# Patient Record
Sex: Female | Born: 1985 | Race: Black or African American | Hispanic: No | Marital: Married | Smoking: Never smoker
Health system: Southern US, Community
[De-identification: ages and names within clinical notes are randomized; demographics above are authoritative.]

## PROBLEM LIST (undated history)

## (undated) DIAGNOSIS — T7840XA Allergy, unspecified, initial encounter: Secondary | ICD-10-CM

## (undated) DIAGNOSIS — R51 Headache: Secondary | ICD-10-CM

## (undated) DIAGNOSIS — K219 Gastro-esophageal reflux disease without esophagitis: Secondary | ICD-10-CM

## (undated) HISTORY — DX: Allergy, unspecified, initial encounter: T78.40XA

## (undated) HISTORY — DX: Gastro-esophageal reflux disease without esophagitis: K21.9

## (undated) HISTORY — PX: MOUTH SURGERY: SHX715

## (undated) HISTORY — DX: Headache: R51

---
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
## 2004-02-03 ENCOUNTER — Emergency Department (HOSPITAL_COMMUNITY): Admission: EM | Admit: 2004-02-03 | Discharge: 2004-02-03 | Payer: Self-pay | Admitting: Emergency Medicine

## 2004-03-07 ENCOUNTER — Emergency Department (HOSPITAL_COMMUNITY): Admission: EM | Admit: 2004-03-07 | Discharge: 2004-03-07 | Payer: Self-pay | Admitting: Emergency Medicine

## 2004-03-12 ENCOUNTER — Ambulatory Visit: Payer: Self-pay | Admitting: Internal Medicine

## 2004-04-09 ENCOUNTER — Ambulatory Visit: Payer: Self-pay | Admitting: Internal Medicine

## 2004-04-19 ENCOUNTER — Ambulatory Visit: Payer: Self-pay | Admitting: *Deleted

## 2004-07-05 ENCOUNTER — Ambulatory Visit: Payer: Self-pay | Admitting: Internal Medicine

## 2004-07-09 ENCOUNTER — Ambulatory Visit: Payer: Self-pay | Admitting: Internal Medicine

## 2004-12-02 ENCOUNTER — Emergency Department (HOSPITAL_COMMUNITY): Admission: EM | Admit: 2004-12-02 | Discharge: 2004-12-02 | Payer: Self-pay | Admitting: Emergency Medicine

## 2005-01-28 ENCOUNTER — Emergency Department (HOSPITAL_COMMUNITY): Admission: EM | Admit: 2005-01-28 | Discharge: 2005-01-29 | Payer: Self-pay | Admitting: Emergency Medicine

## 2005-03-12 ENCOUNTER — Emergency Department (HOSPITAL_COMMUNITY): Admission: EM | Admit: 2005-03-12 | Discharge: 2005-03-12 | Payer: Self-pay | Admitting: Emergency Medicine

## 2005-03-28 ENCOUNTER — Emergency Department (HOSPITAL_COMMUNITY): Admission: EM | Admit: 2005-03-28 | Discharge: 2005-03-28 | Payer: Self-pay | Admitting: *Deleted

## 2005-06-25 ENCOUNTER — Emergency Department (HOSPITAL_COMMUNITY): Admission: EM | Admit: 2005-06-25 | Discharge: 2005-06-25 | Payer: Self-pay | Admitting: Emergency Medicine

## 2005-06-28 ENCOUNTER — Ambulatory Visit: Payer: Self-pay | Admitting: Internal Medicine

## 2005-07-07 ENCOUNTER — Encounter (INDEPENDENT_AMBULATORY_CARE_PROVIDER_SITE_OTHER): Payer: Self-pay | Admitting: Internal Medicine

## 2005-08-01 ENCOUNTER — Ambulatory Visit: Payer: Self-pay | Admitting: Internal Medicine

## 2005-08-03 ENCOUNTER — Encounter (INDEPENDENT_AMBULATORY_CARE_PROVIDER_SITE_OTHER): Payer: Self-pay | Admitting: Internal Medicine

## 2005-08-15 ENCOUNTER — Ambulatory Visit: Payer: Self-pay | Admitting: Internal Medicine

## 2005-08-18 ENCOUNTER — Ambulatory Visit: Payer: Self-pay | Admitting: Family Medicine

## 2005-12-16 ENCOUNTER — Emergency Department (HOSPITAL_COMMUNITY): Admission: EM | Admit: 2005-12-16 | Discharge: 2005-12-16 | Payer: Self-pay | Admitting: Emergency Medicine

## 2006-10-20 ENCOUNTER — Encounter (INDEPENDENT_AMBULATORY_CARE_PROVIDER_SITE_OTHER): Payer: Self-pay | Admitting: Internal Medicine

## 2006-10-20 DIAGNOSIS — G43909 Migraine, unspecified, not intractable, without status migrainosus: Secondary | ICD-10-CM | POA: Insufficient documentation

## 2006-10-28 DIAGNOSIS — L509 Urticaria, unspecified: Secondary | ICD-10-CM

## 2006-10-28 DIAGNOSIS — J45909 Unspecified asthma, uncomplicated: Secondary | ICD-10-CM | POA: Insufficient documentation

## 2006-11-20 DIAGNOSIS — K219 Gastro-esophageal reflux disease without esophagitis: Secondary | ICD-10-CM

## 2007-11-03 ENCOUNTER — Emergency Department: Payer: Self-pay | Admitting: Emergency Medicine

## 2007-11-08 ENCOUNTER — Emergency Department (HOSPITAL_COMMUNITY): Admission: EM | Admit: 2007-11-08 | Discharge: 2007-11-09 | Payer: Self-pay | Admitting: Emergency Medicine

## 2007-11-22 ENCOUNTER — Emergency Department (HOSPITAL_COMMUNITY): Admission: EM | Admit: 2007-11-22 | Discharge: 2007-11-22 | Payer: Self-pay | Admitting: Emergency Medicine

## 2008-01-31 ENCOUNTER — Ambulatory Visit: Payer: Self-pay | Admitting: Internal Medicine

## 2008-10-16 ENCOUNTER — Ambulatory Visit: Payer: Self-pay | Admitting: Internal Medicine

## 2008-10-16 DIAGNOSIS — G4700 Insomnia, unspecified: Secondary | ICD-10-CM

## 2008-10-16 DIAGNOSIS — R030 Elevated blood-pressure reading, without diagnosis of hypertension: Secondary | ICD-10-CM | POA: Insufficient documentation

## 2008-10-16 DIAGNOSIS — R51 Headache: Secondary | ICD-10-CM

## 2008-10-16 DIAGNOSIS — M79609 Pain in unspecified limb: Secondary | ICD-10-CM

## 2008-10-16 DIAGNOSIS — R519 Headache, unspecified: Secondary | ICD-10-CM | POA: Insufficient documentation

## 2009-02-15 ENCOUNTER — Encounter (INDEPENDENT_AMBULATORY_CARE_PROVIDER_SITE_OTHER): Payer: Self-pay | Admitting: Internal Medicine

## 2009-02-25 ENCOUNTER — Encounter (INDEPENDENT_AMBULATORY_CARE_PROVIDER_SITE_OTHER): Payer: Self-pay | Admitting: Internal Medicine

## 2010-07-06 NOTE — Letter (Signed)
Summary: [facility]   Imported By: Arta Bruce 10/27/2009 15:17:51  _____________________________________________________________________  External Attachment:    Type:   Image     Comment:   External Document

## 2010-08-06 LAB — HEPATITIS B SURFACE ANTIGEN: Hepatitis B Surface Ag: NEGATIVE

## 2010-08-06 LAB — RPR: RPR: NONREACTIVE

## 2010-08-06 LAB — ANTIBODY SCREEN: Antibody Screen: NEGATIVE

## 2010-08-06 LAB — RUBELLA ANTIBODY, IGM: Rubella: IMMUNE

## 2010-08-06 LAB — HIV ANTIBODY (ROUTINE TESTING W REFLEX): HIV: NONREACTIVE

## 2010-11-08 ENCOUNTER — Inpatient Hospital Stay (HOSPITAL_COMMUNITY)
Admission: AD | Admit: 2010-11-08 | Discharge: 2010-11-09 | Disposition: A | Discharge status: Home / Self Care | Payer: Medicaid Other | Source: Ambulatory Visit | Attending: Obstetrics & Gynecology | Admitting: Obstetrics & Gynecology

## 2010-11-08 DIAGNOSIS — R109 Unspecified abdominal pain: Secondary | ICD-10-CM | POA: Insufficient documentation

## 2010-11-08 DIAGNOSIS — O2390 Unspecified genitourinary tract infection in pregnancy, unspecified trimester: Secondary | ICD-10-CM | POA: Insufficient documentation

## 2010-11-08 DIAGNOSIS — B373 Candidiasis of vulva and vagina: Secondary | ICD-10-CM

## 2010-11-08 DIAGNOSIS — N760 Acute vaginitis: Secondary | ICD-10-CM | POA: Insufficient documentation

## 2010-11-08 DIAGNOSIS — N390 Urinary tract infection, site not specified: Secondary | ICD-10-CM | POA: Insufficient documentation

## 2010-11-08 DIAGNOSIS — A499 Bacterial infection, unspecified: Secondary | ICD-10-CM

## 2010-11-08 DIAGNOSIS — B9689 Other specified bacterial agents as the cause of diseases classified elsewhere: Secondary | ICD-10-CM | POA: Insufficient documentation

## 2010-11-08 DIAGNOSIS — B3731 Acute candidiasis of vulva and vagina: Secondary | ICD-10-CM | POA: Insufficient documentation

## 2010-11-08 LAB — WET PREP, GENITAL

## 2010-11-08 LAB — URINE MICROSCOPIC-ADD ON

## 2010-11-08 LAB — URINALYSIS, ROUTINE W REFLEX MICROSCOPIC
Glucose, UA: NEGATIVE mg/dL
pH: 6 (ref 5.0–8.0)

## 2010-11-10 LAB — URINE CULTURE
Colony Count: 75000
Culture  Setup Time: 201206051722

## 2010-12-01 ENCOUNTER — Other Ambulatory Visit: Payer: Self-pay | Admitting: Obstetrics

## 2010-12-01 DIAGNOSIS — O358XX0 Maternal care for other (suspected) fetal abnormality and damage, not applicable or unspecified: Secondary | ICD-10-CM

## 2010-12-09 ENCOUNTER — Encounter (HOSPITAL_COMMUNITY): Payer: Self-pay

## 2010-12-09 ENCOUNTER — Ambulatory Visit (HOSPITAL_COMMUNITY)
Admission: RE | Admit: 2010-12-09 | Discharge: 2010-12-09 | Disposition: A | Discharge status: Home / Self Care | Payer: Medicaid Other | Source: Ambulatory Visit | Attending: Obstetrics | Admitting: Obstetrics

## 2010-12-09 ENCOUNTER — Other Ambulatory Visit: Payer: Self-pay | Admitting: Obstetrics

## 2010-12-09 DIAGNOSIS — O368390 Maternal care for abnormalities of the fetal heart rate or rhythm, unspecified trimester, not applicable or unspecified: Secondary | ICD-10-CM | POA: Insufficient documentation

## 2010-12-09 DIAGNOSIS — Z3689 Encounter for other specified antenatal screening: Secondary | ICD-10-CM | POA: Insufficient documentation

## 2010-12-09 DIAGNOSIS — O358XX0 Maternal care for other (suspected) fetal abnormality and damage, not applicable or unspecified: Secondary | ICD-10-CM

## 2010-12-24 ENCOUNTER — Encounter (HOSPITAL_COMMUNITY): Payer: Self-pay

## 2010-12-24 ENCOUNTER — Ambulatory Visit (HOSPITAL_COMMUNITY)
Admission: RE | Admit: 2010-12-24 | Discharge: 2010-12-24 | Disposition: A | Discharge status: Home / Self Care | Payer: Medicaid Other | Source: Ambulatory Visit | Attending: Obstetrics | Admitting: Obstetrics

## 2010-12-24 DIAGNOSIS — O358XX0 Maternal care for other (suspected) fetal abnormality and damage, not applicable or unspecified: Secondary | ICD-10-CM

## 2010-12-24 DIAGNOSIS — O368390 Maternal care for abnormalities of the fetal heart rate or rhythm, unspecified trimester, not applicable or unspecified: Secondary | ICD-10-CM | POA: Insufficient documentation

## 2011-01-06 ENCOUNTER — Other Ambulatory Visit: Payer: Self-pay | Admitting: Obstetrics

## 2011-01-06 ENCOUNTER — Ambulatory Visit (HOSPITAL_COMMUNITY)
Admission: RE | Admit: 2011-01-06 | Discharge: 2011-01-06 | Disposition: A | Discharge status: Home / Self Care | Payer: Self-pay | Source: Ambulatory Visit | Attending: Obstetrics | Admitting: Obstetrics

## 2011-01-06 VITALS — BP 116/76 | HR 95 | Wt 160.0 lb

## 2011-01-06 DIAGNOSIS — R030 Elevated blood-pressure reading, without diagnosis of hypertension: Secondary | ICD-10-CM

## 2011-01-06 DIAGNOSIS — O2690 Pregnancy related conditions, unspecified, unspecified trimester: Secondary | ICD-10-CM

## 2011-01-06 NOTE — Progress Notes (Signed)
Encounter addended by: Marlana Latus, RN on: 01/06/2011  3:03 PM<BR>     Documentation filed: Flowsheet VN, Episodes, Chief Complaint Section

## 2011-01-06 NOTE — ED Notes (Signed)
Started [employer] 1 week ago, denies leaking or vag bleeding

## 2011-01-06 NOTE — Progress Notes (Signed)
NST reactive, with irregular heart rate consistent with premature atrial contractions.

## 2011-01-13 ENCOUNTER — Ambulatory Visit (HOSPITAL_COMMUNITY)
Admission: RE | Admit: 2011-01-13 | Discharge: 2011-01-13 | Disposition: A | Discharge status: Home / Self Care | Payer: Medicaid Other | Source: Ambulatory Visit | Attending: Obstetrics | Admitting: Obstetrics

## 2011-01-13 ENCOUNTER — Other Ambulatory Visit (HOSPITAL_COMMUNITY): Payer: Self-pay | Admitting: Obstetrics and Gynecology

## 2011-01-13 VITALS — BP 116/74 | HR 97 | Wt 162.0 lb

## 2011-01-13 DIAGNOSIS — Z3689 Encounter for other specified antenatal screening: Secondary | ICD-10-CM | POA: Insufficient documentation

## 2011-01-13 DIAGNOSIS — R030 Elevated blood-pressure reading, without diagnosis of hypertension: Secondary | ICD-10-CM

## 2011-01-13 DIAGNOSIS — O368390 Maternal care for abnormalities of the fetal heart rate or rhythm, unspecified trimester, not applicable or unspecified: Secondary | ICD-10-CM | POA: Insufficient documentation

## 2011-01-13 DIAGNOSIS — Q249 Congenital malformation of heart, unspecified: Secondary | ICD-10-CM

## 2011-01-13 NOTE — Progress Notes (Signed)
Encounter addended by: Marlana Latus, RN on: 01/13/2011  3:55 PM<BR>     Documentation filed: Normajean Glasgow VN

## 2011-01-13 NOTE — Progress Notes (Signed)
Ultrasound in AS/OBGYN/EPIC.  Follow up as scheduled

## 2011-01-20 ENCOUNTER — Ambulatory Visit (HOSPITAL_COMMUNITY)
Admission: RE | Admit: 2011-01-20 | Discharge: 2011-01-20 | Disposition: A | Discharge status: Home / Self Care | Payer: Medicaid Other | Source: Ambulatory Visit | Attending: Obstetrics | Admitting: Obstetrics

## 2011-01-20 DIAGNOSIS — O368390 Maternal care for abnormalities of the fetal heart rate or rhythm, unspecified trimester, not applicable or unspecified: Secondary | ICD-10-CM | POA: Insufficient documentation

## 2011-01-27 ENCOUNTER — Ambulatory Visit (HOSPITAL_COMMUNITY)
Admission: RE | Admit: 2011-01-27 | Discharge: 2011-01-27 | Disposition: A | Discharge status: Home / Self Care | Payer: Medicaid Other | Source: Ambulatory Visit | Attending: Obstetrics | Admitting: Obstetrics

## 2011-01-27 DIAGNOSIS — O368390 Maternal care for abnormalities of the fetal heart rate or rhythm, unspecified trimester, not applicable or unspecified: Secondary | ICD-10-CM | POA: Insufficient documentation

## 2011-02-03 ENCOUNTER — Other Ambulatory Visit: Payer: Self-pay | Admitting: Obstetrics

## 2011-02-03 ENCOUNTER — Ambulatory Visit (HOSPITAL_COMMUNITY)
Admission: RE | Admit: 2011-02-03 | Discharge: 2011-02-03 | Disposition: A | Discharge status: Home / Self Care | Payer: Medicaid Other | Source: Ambulatory Visit | Attending: Obstetrics | Admitting: Obstetrics

## 2011-02-03 DIAGNOSIS — O368390 Maternal care for abnormalities of the fetal heart rate or rhythm, unspecified trimester, not applicable or unspecified: Secondary | ICD-10-CM

## 2011-02-03 NOTE — Progress Notes (Signed)
Vital signs reviewed; see ultrasound report 

## 2011-02-09 ENCOUNTER — Encounter (HOSPITAL_COMMUNITY): Payer: Self-pay | Admitting: *Deleted

## 2011-02-09 ENCOUNTER — Telehealth (HOSPITAL_COMMUNITY): Payer: Self-pay | Admitting: *Deleted

## 2011-02-09 NOTE — Telephone Encounter (Signed)
Preadmission screen  

## 2011-02-10 ENCOUNTER — Inpatient Hospital Stay (HOSPITAL_COMMUNITY): Admission: RE | Admit: 2011-02-10 | Payer: Self-pay | Source: Ambulatory Visit | Admitting: Obstetrics

## 2011-02-11 ENCOUNTER — Inpatient Hospital Stay (HOSPITAL_COMMUNITY)
Admission: AD | Admit: 2011-02-11 | Discharge: 2011-02-15 | DRG: 765 | Disposition: A | Discharge status: Home / Self Care | Payer: Medicaid Other | Source: Ambulatory Visit | Attending: Obstetrics | Admitting: Obstetrics

## 2011-02-11 ENCOUNTER — Encounter (HOSPITAL_COMMUNITY): Payer: Self-pay | Admitting: *Deleted

## 2011-02-11 DIAGNOSIS — G4700 Insomnia, unspecified: Secondary | ICD-10-CM

## 2011-02-11 DIAGNOSIS — J45909 Unspecified asthma, uncomplicated: Secondary | ICD-10-CM

## 2011-02-11 DIAGNOSIS — M79609 Pain in unspecified limb: Secondary | ICD-10-CM

## 2011-02-11 DIAGNOSIS — R51 Headache: Secondary | ICD-10-CM

## 2011-02-11 DIAGNOSIS — L509 Urticaria, unspecified: Secondary | ICD-10-CM

## 2011-02-11 DIAGNOSIS — O411090 Infection of amniotic sac and membranes, unspecified, unspecified trimester, not applicable or unspecified: Secondary | ICD-10-CM | POA: Diagnosis present

## 2011-02-11 DIAGNOSIS — G43909 Migraine, unspecified, not intractable, without status migrainosus: Secondary | ICD-10-CM

## 2011-02-11 DIAGNOSIS — R030 Elevated blood-pressure reading, without diagnosis of hypertension: Secondary | ICD-10-CM

## 2011-02-11 DIAGNOSIS — K219 Gastro-esophageal reflux disease without esophagitis: Secondary | ICD-10-CM

## 2011-02-11 DIAGNOSIS — O4290 Premature rupture of membranes, unspecified as to length of time between rupture and onset of labor, unspecified weeks of gestation: Secondary | ICD-10-CM | POA: Diagnosis present

## 2011-02-11 LAB — CBC
MCH: 26 pg (ref 26.0–34.0)
MCHC: 33.2 g/dL (ref 30.0–36.0)
MCV: 78.1 fL (ref 78.0–100.0)
Platelets: 263 10*3/uL (ref 150–400)
RDW: 15 % (ref 11.5–15.5)
WBC: 7.5 10*3/uL (ref 4.0–10.5)

## 2011-02-11 MED ORDER — EPHEDRINE 5 MG/ML INJ
10.0000 mg | INTRAVENOUS | Status: DC | PRN
  Filled 2011-02-11 (×2): qty 4

## 2011-02-11 MED ORDER — PHENYLEPHRINE 40 MCG/ML (10ML) SYRINGE FOR IV PUSH (FOR BLOOD PRESSURE SUPPORT)
80.0000 ug | PREFILLED_SYRINGE | INTRAVENOUS | Status: DC | PRN
  Filled 2011-02-11 (×2): qty 5

## 2011-02-11 MED ORDER — OXYTOCIN BOLUS FROM INFUSION
500.0000 mL | Freq: Once | INTRAVENOUS | Status: DC
  Filled 2011-02-11: qty 1000
  Filled 2011-02-11: qty 500

## 2011-02-11 MED ORDER — BUTORPHANOL TARTRATE 2 MG/ML IJ SOLN
1.0000 mg | INTRAMUSCULAR | Status: DC | PRN
  Administered 2011-02-11: 1 mg via INTRAVENOUS
  Filled 2011-02-11: qty 1

## 2011-02-11 MED ORDER — EPHEDRINE 5 MG/ML INJ
10.0000 mg | INTRAVENOUS | Status: DC | PRN
  Filled 2011-02-11: qty 4

## 2011-02-11 MED ORDER — OXYTOCIN 20 UNITS IN LACTATED RINGERS INFUSION - SIMPLE
125.0000 mL/h | Freq: Once | INTRAVENOUS | Status: DC
  Filled 2011-02-11: qty 1000

## 2011-02-11 MED ORDER — ACETAMINOPHEN 325 MG PO TABS
650.0000 mg | ORAL_TABLET | ORAL | Status: DC | PRN
  Administered 2011-02-12 (×2): 650 mg via ORAL
  Filled 2011-02-11 (×2): qty 2

## 2011-02-11 MED ORDER — LACTATED RINGERS IV SOLN
500.0000 mL | INTRAVENOUS | Status: DC | PRN
  Administered 2011-02-11: 1000 mL via INTRAVENOUS
  Administered 2011-02-11: 300 mL via INTRAVENOUS

## 2011-02-11 MED ORDER — CITRIC ACID-SODIUM CITRATE 334-500 MG/5ML PO SOLN
30.0000 mL | ORAL | Status: DC | PRN
  Administered 2011-02-12: 30 mL via ORAL
  Filled 2011-02-11: qty 15

## 2011-02-11 MED ORDER — FENTANYL 2.5 MCG/ML BUPIVACAINE 1/10 % EPIDURAL INFUSION (WH - ANES)
14.0000 mL/h | INTRAMUSCULAR | Status: DC
  Administered 2011-02-11 – 2011-02-12 (×6): 14 mL/h via EPIDURAL
  Filled 2011-02-11 (×6): qty 60

## 2011-02-11 MED ORDER — PROMETHAZINE HCL 25 MG/ML IJ SOLN: 12.5000 mg | INTRAMUSCULAR | Status: DC | PRN

## 2011-02-11 MED ORDER — LACTATED RINGERS IV SOLN
INTRAVENOUS | Status: DC
  Administered 2011-02-11 – 2011-02-12 (×5): via INTRAVENOUS
  Administered 2011-02-12: 500 mL via INTRAVENOUS
  Administered 2011-02-12 (×2): via INTRAVENOUS

## 2011-02-11 MED ORDER — FLEET ENEMA 7-19 GM/118ML RE ENEM: 1.0000 | ENEMA | RECTAL | Status: DC | PRN

## 2011-02-11 MED ORDER — TERBUTALINE SULFATE 1 MG/ML IJ SOLN: 0.2500 mg | Freq: Once | INTRAMUSCULAR | Status: AC | PRN

## 2011-02-11 MED ORDER — OXYCODONE-ACETAMINOPHEN 5-325 MG PO TABS: 2.0000 | ORAL_TABLET | ORAL | Status: DC | PRN

## 2011-02-11 MED ORDER — ONDANSETRON HCL 4 MG/2ML IJ SOLN: 4.0000 mg | Freq: Four times a day (QID) | INTRAMUSCULAR | Status: DC | PRN

## 2011-02-11 MED ORDER — IBUPROFEN 600 MG PO TABS: 600.0000 mg | ORAL_TABLET | Freq: Four times a day (QID) | ORAL | Status: DC | PRN

## 2011-02-11 MED ORDER — LACTATED RINGERS IV SOLN
500.0000 mL | Freq: Once | INTRAVENOUS | Status: AC
  Administered 2011-02-11: 1000 mL via INTRAVENOUS

## 2011-02-11 MED ORDER — DIPHENHYDRAMINE HCL 50 MG/ML IJ SOLN: 12.5000 mg | INTRAMUSCULAR | Status: DC | PRN

## 2011-02-11 MED ORDER — LIDOCAINE HCL (PF) 1 % IJ SOLN
30.0000 mL | INTRAMUSCULAR | Status: DC | PRN
  Filled 2011-02-11: qty 30

## 2011-02-11 MED ORDER — OXYTOCIN 20 UNITS IN LACTATED RINGERS INFUSION - SIMPLE
1.0000 m[IU]/min | INTRAVENOUS | Status: DC
  Administered 2011-02-11: 18 m[IU]/min via INTRAVENOUS
  Administered 2011-02-11: 2 m[IU]/min via INTRAVENOUS
  Administered 2011-02-12 (×2): 24 m[IU]/min via INTRAVENOUS
  Administered 2011-02-12: 20 m[IU]/min via INTRAVENOUS

## 2011-02-11 MED ORDER — LIDOCAINE HCL 1.5 % IJ SOLN
INTRAMUSCULAR | Status: DC | PRN
  Administered 2011-02-11 (×2): 5 mL via EPIDURAL
  Administered 2011-02-11: 2 mL via EPIDURAL

## 2011-02-11 MED ORDER — PHENYLEPHRINE 40 MCG/ML (10ML) SYRINGE FOR IV PUSH (FOR BLOOD PRESSURE SUPPORT)
80.0000 ug | PREFILLED_SYRINGE | INTRAVENOUS | Status: DC | PRN
  Filled 2011-02-11: qty 5

## 2011-02-11 NOTE — Anesthesia Preprocedure Evaluation (Signed)
Anesthesia Evaluation  Name, MR# and DOB Patient awake  General Assessment Comment  Reviewed: Allergy & Precautions, H&P , NPO status , Patient's Chart, lab work & pertinent test results, reviewed documented beta blocker date and time   History of Anesthesia Complications Negative for: history of anesthetic complications  Airway Mallampati: I TM Distance: >3 FB Neck ROM: full    Dental  (+) Teeth Intact   Pulmonary  asthma (last inhaler use 2 weeks ago, hospitalized x3 weeks for asthma in 2010 (related to a URI))  clear to auscultation  breath sounds clear to auscultation none    Cardiovascular regular Normal    Neuro/Psych   Headaches (migraines, last 2 mos ago)   Negative Psych ROS  GI/Hepatic/Renal negative GI ROS  negative Liver ROS  negative Renal ROS        Endo/Other  Negative Endocrine ROS (+)      Abdominal   Musculoskeletal   Hematology negative hematology ROS (+)   Peds  Reproductive/Obstetrics (+) Pregnancy    Anesthesia Other Findings Multiple piercings, asked to remove            Anesthesia Physical Anesthesia Plan  ASA: II  Anesthesia Plan: Epidural   Post-op Pain Management:    Induction:   Airway Management Planned:   Additional Equipment:   Intra-op Plan:   Post-operative Plan:   Informed Consent: I have reviewed the patients History and Physical, chart, labs and discussed the procedure including the risks, benefits and alternatives for the proposed anesthesia with the patient or authorized representative who has indicated his/her understanding and acceptance.     Plan Discussed with:   Anesthesia Plan Comments:         Anesthesia Quick Evaluation

## 2011-02-11 NOTE — Anesthesia Procedure Notes (Signed)
Epidural Patient location during procedure: OB Start time: 02/11/2011 3:25 PM Reason for block: procedure for pain  Staffing Performed by: anesthesiologist   Preanesthetic Checklist Completed: patient identified, site marked, surgical consent, pre-op evaluation, timeout performed, IV checked, risks and benefits discussed and monitors and equipment checked  Epidural Patient position: sitting Prep: site prepped and draped and DuraPrep Patient monitoring: continuous pulse ox and blood pressure Approach: midline Injection technique: LOR air  Needle:  Needle type: Tuohy  Needle gauge: 17 G Needle length: 9 cm Catheter type: closed end flexible Catheter size: 19 Gauge Catheter at skin depth: 10 cm Test dose: negative  Assessment Events: blood not aspirated, injection not painful, no injection resistance, negative IV test and no paresthesia  Additional Notes Discussed risk of headache, infection, bleeding, nerve injury and failed or incomplete block.  Patient voices understanding and wishes to proceed.

## 2011-02-11 NOTE — H&P (Signed)
Heidi Hoover is a 25 y.o. female presenting for SROM. Maternal Medical History:  Reason for admission: Reason for admission: rupture of membranes.  Reason for Admission:   nauseaContractions: Frequency: irregular.    Fetal activity: Perceived fetal activity is decreased.   Last perceived fetal movement was within the past 12 hours.    Prenatal complications: Fetal PACs    OB History    Grav Para Term Preterm Abortions TAB SAB Ect Mult Living   1              Past Medical History  Diagnosis Date  . Headache     miagraine  . Asthma     albuterol solution, inhaler, singulair, zertec   Past Surgical History  Procedure Date  . Mouth surgery    Family History: family history includes Arthritis in her maternal grandmother; Asthma in her father; Heart disease in her brother and maternal grandmother; and Mental retardation in her maternal uncle. Social History:  reports that she has never smoked. She does not have any smokeless tobacco history on file. She reports that she does not drink alcohol or use illicit drugs.  Review of Systems  Constitutional: Negative for fever.  Eyes: Negative for blurred vision.  Respiratory: Negative for shortness of breath.   Gastrointestinal: Negative for nausea and vomiting.  Skin: Negative for rash.  Neurological: Negative for headaches.    Dilation: 1 Effacement (%): 90 Station: -2 Exam by:: weston,rn Blood pressure 130/75, pulse 88, temperature 98.6 F (37 C), temperature source Oral, resp. rate 18, height 5\' 2"  (1.575 m), weight 75.297 kg (166 lb), last menstrual period 05/02/2010. Maternal Exam:  Uterine Assessment: Contraction strength is mild.  Contraction frequency is irregular.   Abdomen: Patient reports no abdominal tenderness. Fetal presentation: vertex  Introitus: not evaluated.     Fetal Exam Fetal Monitor Review: Variability: moderate (6-25 bpm).   Pattern: no accelerations and early decelerations.    Fetal State  Assessment: Category II - tracings are indeterminate.     Physical Exam  Constitutional: She appears well-developed.  HENT:  Head: Normocephalic.  Neck: Neck supple. No thyromegaly present.  Cardiovascular: Normal rate and regular rhythm.   Respiratory: Breath sounds normal.  GI: Soft. Bowel sounds are normal.  Skin: No rash noted.    Prenatal labs: ABO, Rh: O/Positive/-- (03/02 0000) Antibody: Negative (03/02 0000) Rubella:   RPR: Nonreactive (03/02 0000)  HBsAg: Negative (03/02 0000)  HIV: Non-reactive (03/02 0000)  GBS: Negative (08/06 0000)   Assessment/Plan: [place] @ [redacted]w[redacted]d w/ PROM Category II FHT--c/w fetal well-being  Admit Augment labor w/low dose Pitocin per protocol   JACKSON-MOORE,Stclair Szymborski A 02/11/2011, 11:14 AM

## 2011-02-11 NOTE — Progress Notes (Signed)
Pt reports ROM at 0500, some contractions

## 2011-02-12 ENCOUNTER — Encounter (HOSPITAL_COMMUNITY): Payer: Self-pay | Admitting: Anesthesiology

## 2011-02-12 ENCOUNTER — Inpatient Hospital Stay (HOSPITAL_COMMUNITY): Payer: Medicaid Other | Admitting: Anesthesiology

## 2011-02-12 ENCOUNTER — Encounter (HOSPITAL_COMMUNITY): Payer: Self-pay

## 2011-02-12 ENCOUNTER — Other Ambulatory Visit: Payer: Self-pay | Admitting: Obstetrics & Gynecology

## 2011-02-12 ENCOUNTER — Encounter (HOSPITAL_COMMUNITY)
Admission: AD | Disposition: A | Discharge status: Home / Self Care | Payer: Self-pay | Source: Ambulatory Visit | Attending: Obstetrics

## 2011-02-12 SURGERY — Surgical Case
Anesthesia: Regional | Site: Abdomen | Wound class: Clean Contaminated

## 2011-02-12 MED ORDER — ONDANSETRON HCL 4 MG/2ML IJ SOLN: 4.0000 mg | INTRAMUSCULAR | Status: DC | PRN

## 2011-02-12 MED ORDER — FENTANYL CITRATE 0.05 MG/ML IJ SOLN
25.0000 ug | INTRAMUSCULAR | Status: DC | PRN
  Administered 2011-02-12: 50 ug via INTRAVENOUS

## 2011-02-12 MED ORDER — DIPHENHYDRAMINE HCL 50 MG/ML IJ SOLN
INTRAMUSCULAR | Status: AC
  Filled 2011-02-12: qty 1

## 2011-02-12 MED ORDER — SODIUM CHLORIDE 0.9 % IJ SOLN: 3.0000 mL | INTRAMUSCULAR | Status: DC | PRN

## 2011-02-12 MED ORDER — IBUPROFEN 600 MG PO TABS
600.0000 mg | ORAL_TABLET | Freq: Four times a day (QID) | ORAL | Status: DC | PRN
  Filled 2011-02-12 (×7): qty 1

## 2011-02-12 MED ORDER — OXYTOCIN 10 UNIT/ML IJ SOLN
INTRAMUSCULAR | Status: AC
  Filled 2011-02-12: qty 4

## 2011-02-12 MED ORDER — MEPERIDINE HCL 25 MG/ML IJ SOLN: 6.2500 mg | INTRAMUSCULAR | Status: DC | PRN

## 2011-02-12 MED ORDER — DIPHENHYDRAMINE HCL 50 MG/ML IJ SOLN: 25.0000 mg | INTRAMUSCULAR | Status: DC | PRN

## 2011-02-12 MED ORDER — LIDOCAINE-EPINEPHRINE (PF) 2 %-1:200000 IJ SOLN
INTRAMUSCULAR | Status: AC
  Filled 2011-02-12: qty 20

## 2011-02-12 MED ORDER — SODIUM BICARBONATE 8.4 % IV SOLN
INTRAVENOUS | Status: DC | PRN
  Administered 2011-02-12: 5 mL via EPIDURAL

## 2011-02-12 MED ORDER — PHENYLEPHRINE HCL 10 MG/ML IJ SOLN
INTRAMUSCULAR | Status: DC | PRN
  Administered 2011-02-12: 80 ug via INTRAVENOUS
  Administered 2011-02-12: 40 ug via INTRAVENOUS
  Administered 2011-02-12: 80 ug via INTRAVENOUS
  Administered 2011-02-12: 40 ug via INTRAVENOUS

## 2011-02-12 MED ORDER — NALOXONE HCL 0.4 MG/ML IJ SOLN
1.0000 ug/kg/h | INTRAMUSCULAR | Status: DC | PRN
  Filled 2011-02-12: qty 2.5

## 2011-02-12 MED ORDER — SODIUM CHLORIDE 0.9 % IR SOLN
Status: DC | PRN
  Administered 2011-02-12: 1000 mL

## 2011-02-12 MED ORDER — NALBUPHINE SYRINGE 5 MG/0.5 ML
5.0000 mg | INJECTION | INTRAMUSCULAR | Status: DC | PRN
  Filled 2011-02-12: qty 1

## 2011-02-12 MED ORDER — MORPHINE SULFATE 0.5 MG/ML IJ SOLN
INTRAMUSCULAR | Status: AC
  Filled 2011-02-12: qty 10

## 2011-02-12 MED ORDER — METOCLOPRAMIDE HCL 5 MG/ML IJ SOLN: 10.0000 mg | Freq: Once | INTRAMUSCULAR | Status: DC | PRN

## 2011-02-12 MED ORDER — OXYTOCIN 20 UNITS IN LACTATED RINGERS INFUSION - SIMPLE
INTRAVENOUS | Status: DC | PRN
  Administered 2011-02-12: 40 [IU] via INTRAVENOUS

## 2011-02-12 MED ORDER — ONDANSETRON HCL 4 MG/2ML IJ SOLN
INTRAMUSCULAR | Status: AC
  Filled 2011-02-12: qty 2

## 2011-02-12 MED ORDER — SIMETHICONE 80 MG PO CHEW
80.0000 mg | CHEWABLE_TABLET | ORAL | Status: DC | PRN
  Administered 2011-02-12 – 2011-02-14 (×10): 80 mg via ORAL

## 2011-02-12 MED ORDER — KETOROLAC TROMETHAMINE 60 MG/2ML IM SOLN
INTRAMUSCULAR | Status: AC
  Administered 2011-02-12: 60 mg via INTRAMUSCULAR
  Filled 2011-02-12: qty 2

## 2011-02-12 MED ORDER — MEPERIDINE HCL 25 MG/ML IJ SOLN
INTRAMUSCULAR | Status: AC
  Filled 2011-02-12: qty 1

## 2011-02-12 MED ORDER — SENNOSIDES-DOCUSATE SODIUM 8.6-50 MG PO TABS
2.0000 | ORAL_TABLET | Freq: Every day | ORAL | Status: DC
  Administered 2011-02-12 – 2011-02-14 (×3): 2 via ORAL

## 2011-02-12 MED ORDER — MORPHINE SULFATE (PF) 0.5 MG/ML IJ SOLN
INTRAMUSCULAR | Status: DC | PRN
  Administered 2011-02-12: 2 mg via INTRAVENOUS

## 2011-02-12 MED ORDER — EPHEDRINE 5 MG/ML INJ
INTRAVENOUS | Status: AC
  Filled 2011-02-12: qty 10

## 2011-02-12 MED ORDER — PHENYLEPHRINE 40 MCG/ML (10ML) SYRINGE FOR IV PUSH (FOR BLOOD PRESSURE SUPPORT)
PREFILLED_SYRINGE | INTRAVENOUS | Status: AC
  Filled 2011-02-12: qty 5

## 2011-02-12 MED ORDER — WITCH HAZEL-GLYCERIN EX PADS: 1.0000 "application " | MEDICATED_PAD | CUTANEOUS | Status: DC | PRN

## 2011-02-12 MED ORDER — MEPERIDINE HCL 25 MG/ML IJ SOLN
INTRAMUSCULAR | Status: DC | PRN
  Administered 2011-02-12: 25 mg via INTRAVENOUS

## 2011-02-12 MED ORDER — OXYTOCIN 20 UNITS IN LACTATED RINGERS INFUSION - SIMPLE
INTRAVENOUS | Status: AC
  Filled 2011-02-12: qty 1000

## 2011-02-12 MED ORDER — DIBUCAINE 1 % RE OINT: 1.0000 "application " | TOPICAL_OINTMENT | RECTAL | Status: DC | PRN

## 2011-02-12 MED ORDER — ZOLPIDEM TARTRATE 5 MG PO TABS: 5.0000 mg | ORAL_TABLET | Freq: Every evening | ORAL | Status: DC | PRN

## 2011-02-12 MED ORDER — MORPHINE SULFATE (PF) 0.5 MG/ML IJ SOLN
INTRAMUSCULAR | Status: DC | PRN
Start: 2011-02-12 — End: 2011-02-12
  Administered 2011-02-12: 3 mg via EPIDURAL

## 2011-02-12 MED ORDER — MAGNESIUM HYDROXIDE 400 MG/5ML PO SUSP: 30.0000 mL | ORAL | Status: DC | PRN

## 2011-02-12 MED ORDER — IBUPROFEN 600 MG PO TABS
600.0000 mg | ORAL_TABLET | Freq: Four times a day (QID) | ORAL | Status: DC
  Administered 2011-02-12 – 2011-02-15 (×10): 600 mg via ORAL
  Filled 2011-02-12 (×3): qty 1

## 2011-02-12 MED ORDER — LANOLIN HYDROUS EX OINT: 1.0000 "application " | TOPICAL_OINTMENT | CUTANEOUS | Status: DC | PRN

## 2011-02-12 MED ORDER — OXYCODONE-ACETAMINOPHEN 5-325 MG PO TABS
1.0000 | ORAL_TABLET | ORAL | Status: DC | PRN
  Administered 2011-02-13 (×4): 1 via ORAL
  Administered 2011-02-14: 2 via ORAL
  Administered 2011-02-14: 1 via ORAL
  Administered 2011-02-14 (×2): 2 via ORAL
  Administered 2011-02-15: 1 via ORAL
  Administered 2011-02-15: 2 via ORAL
  Filled 2011-02-12 (×4): qty 2
  Filled 2011-02-12 (×2): qty 1
  Filled 2011-02-12 (×4): qty 2
  Filled 2011-02-12: qty 1
  Filled 2011-02-12 (×2): qty 2
  Filled 2011-02-12: qty 1
  Filled 2011-02-12 (×5): qty 2
  Filled 2011-02-12: qty 1

## 2011-02-12 MED ORDER — MEASLES, MUMPS & RUBELLA VAC ~~LOC~~ INJ
0.5000 mL | INJECTION | Freq: Once | SUBCUTANEOUS | Status: DC
  Filled 2011-02-12: qty 0.5

## 2011-02-12 MED ORDER — MIDAZOLAM HCL 2 MG/2ML IJ SOLN
INTRAMUSCULAR | Status: AC
  Filled 2011-02-12: qty 2

## 2011-02-12 MED ORDER — PHENYLEPHRINE 40 MCG/ML (10ML) SYRINGE FOR IV PUSH (FOR BLOOD PRESSURE SUPPORT)
PREFILLED_SYRINGE | INTRAVENOUS | Status: AC
  Filled 2011-02-12: qty 10

## 2011-02-12 MED ORDER — MEDROXYPROGESTERONE ACETATE 150 MG/ML IM SUSP: 150.0000 mg | INTRAMUSCULAR | Status: DC | PRN

## 2011-02-12 MED ORDER — ONDANSETRON HCL 4 MG/2ML IJ SOLN
INTRAMUSCULAR | Status: DC | PRN
  Administered 2011-02-12: 4 mg via INTRAVENOUS

## 2011-02-12 MED ORDER — KETOROLAC TROMETHAMINE 30 MG/ML IJ SOLN: 30.0000 mg | Freq: Four times a day (QID) | INTRAMUSCULAR | Status: AC | PRN

## 2011-02-12 MED ORDER — TETANUS-DIPHTH-ACELL PERTUSSIS 5-2.5-18.5 LF-MCG/0.5 IM SUSP
0.5000 mL | Freq: Once | INTRAMUSCULAR | Status: AC
Start: 2011-02-13 — End: 2011-02-13
  Administered 2011-02-13: 0.5 mL via INTRAMUSCULAR
  Filled 2011-02-12: qty 0.5

## 2011-02-12 MED ORDER — DIPHENHYDRAMINE HCL 50 MG/ML IJ SOLN
12.5000 mg | INTRAMUSCULAR | Status: DC | PRN
  Administered 2011-02-12: 12.5 mg via INTRAVENOUS

## 2011-02-12 MED ORDER — DIPHENHYDRAMINE HCL 25 MG PO CAPS
25.0000 mg | ORAL_CAPSULE | ORAL | Status: DC | PRN
  Administered 2011-02-12: 25 mg via ORAL
  Filled 2011-02-12: qty 1

## 2011-02-12 MED ORDER — NALOXONE HCL 0.4 MG/ML IJ SOLN: 0.4000 mg | INTRAMUSCULAR | Status: DC | PRN

## 2011-02-12 MED ORDER — SCOPOLAMINE 1 MG/3DAYS TD PT72
MEDICATED_PATCH | TRANSDERMAL | Status: AC
  Administered 2011-02-12: 1.5 mg via TRANSDERMAL
  Filled 2011-02-12: qty 1

## 2011-02-12 MED ORDER — SODIUM BICARBONATE 8.4 % IV SOLN
INTRAVENOUS | Status: AC
  Filled 2011-02-12: qty 50

## 2011-02-12 MED ORDER — CLINDAMYCIN PHOSPHATE 900 MG/50ML IV SOLN
900.0000 mg | Freq: Three times a day (TID) | INTRAVENOUS | Status: DC
  Administered 2011-02-12 (×2): 900 mg via INTRAVENOUS
  Filled 2011-02-12 (×4): qty 50

## 2011-02-12 MED ORDER — FERROUS SULFATE 325 (65 FE) MG PO TABS
325.0000 mg | ORAL_TABLET | Freq: Two times a day (BID) | ORAL | Status: DC
  Administered 2011-02-13 – 2011-02-15 (×5): 325 mg via ORAL
  Filled 2011-02-12 (×5): qty 1

## 2011-02-12 MED ORDER — PRENATAL PLUS 27-1 MG PO TABS
1.0000 | ORAL_TABLET | Freq: Every day | ORAL | Status: DC
  Administered 2011-02-13 – 2011-02-15 (×3): 1 via ORAL
  Filled 2011-02-12 (×3): qty 1

## 2011-02-12 MED ORDER — FENTANYL CITRATE 0.05 MG/ML IJ SOLN
INTRAMUSCULAR | Status: AC
  Filled 2011-02-12: qty 2

## 2011-02-12 MED ORDER — FENTANYL CITRATE 0.05 MG/ML IJ SOLN
INTRAMUSCULAR | Status: AC
  Filled 2011-02-12: qty 5

## 2011-02-12 MED ORDER — LACTATED RINGERS IV SOLN: INTRAVENOUS | Status: DC

## 2011-02-12 MED ORDER — ACETAMINOPHEN 325 MG PO TABS
650.0000 mg | ORAL_TABLET | Freq: Once | ORAL | Status: AC
  Administered 2011-02-12: 650 mg via ORAL
  Filled 2011-02-12: qty 2

## 2011-02-12 MED ORDER — DIPHENHYDRAMINE HCL 25 MG PO CAPS: 25.0000 mg | ORAL_CAPSULE | Freq: Four times a day (QID) | ORAL | Status: DC | PRN

## 2011-02-12 MED ORDER — SCOPOLAMINE 1 MG/3DAYS TD PT72
1.0000 | MEDICATED_PATCH | Freq: Once | TRANSDERMAL | Status: AC
  Administered 2011-02-12: 1.5 mg via TRANSDERMAL

## 2011-02-12 MED ORDER — GENTAMICIN SULFATE 40 MG/ML IJ SOLN
150.0000 mg | Freq: Once | INTRAVENOUS | Status: AC
  Administered 2011-02-12: 150 mg via INTRAVENOUS
  Filled 2011-02-12: qty 3.75

## 2011-02-12 MED ORDER — KETOROLAC TROMETHAMINE 60 MG/2ML IM SOLN
60.0000 mg | Freq: Once | INTRAMUSCULAR | Status: AC | PRN
  Administered 2011-02-12: 60 mg via INTRAMUSCULAR

## 2011-02-12 MED ORDER — OXYTOCIN 20 UNITS IN LACTATED RINGERS INFUSION - SIMPLE
125.0000 mL/h | INTRAVENOUS | Status: AC
  Administered 2011-02-12: 125 mL/h via INTRAVENOUS
  Filled 2011-02-12: qty 1000

## 2011-02-12 SURGICAL SUPPLY — 48 items
APL SKNCLS STERI-STRIP NONHPOA (GAUZE/BANDAGES/DRESSINGS) ×1
BENZOIN TINCTURE PRP APPL 2/3 (GAUZE/BANDAGES/DRESSINGS) ×1 IMPLANT
CANISTER WOUND CARE 500ML ATS (WOUND CARE) IMPLANT
CHLORAPREP W/TINT 26ML (MISCELLANEOUS) ×2 IMPLANT
CLOSURE STERI STRIP 1/2 X4 (GAUZE/BANDAGES/DRESSINGS) ×1 IMPLANT
CLOTH BEACON ORANGE TIMEOUT ST (SAFETY) ×2 IMPLANT
CONTAINER PREFILL 10% NBF 15ML (MISCELLANEOUS) IMPLANT
DERMABOND ADVANCED (GAUZE/BANDAGES/DRESSINGS) ×1 IMPLANT
DRESSING TELFA 8X3 (GAUZE/BANDAGES/DRESSINGS) ×2 IMPLANT
DRSG COVADERM 4X10 (GAUZE/BANDAGES/DRESSINGS) ×1 IMPLANT
DRSG PAD ABDOMINAL 8X10 ST (GAUZE/BANDAGES/DRESSINGS) ×1 IMPLANT
DRSG VAC ATS LRG SENSATRAC (GAUZE/BANDAGES/DRESSINGS) IMPLANT
DRSG VAC ATS MED SENSATRAC (GAUZE/BANDAGES/DRESSINGS) IMPLANT
DRSG VAC ATS SM SENSATRAC (GAUZE/BANDAGES/DRESSINGS) IMPLANT
ELECT REM PT RETURN 9FT ADLT (ELECTROSURGICAL) ×2
ELECTRODE REM PT RTRN 9FT ADLT (ELECTROSURGICAL) ×1 IMPLANT
EXTRACTOR VACUUM M CUP 4 TUBE (SUCTIONS) IMPLANT
GAUZE SPONGE 4X4 12PLY STRL LF (GAUZE/BANDAGES/DRESSINGS) ×2 IMPLANT
GLOVE BIO SURGEON STRL SZ 6.5 (GLOVE) ×4 IMPLANT
GLOVE SKINSENSE NS SZ6.5 (GLOVE) ×2
GLOVE SKINSENSE STRL SZ6.5 (GLOVE) IMPLANT
GOWN PREVENTION PLUS LG XLONG (DISPOSABLE) ×5 IMPLANT
KIT ABG SYR 3ML LUER SLIP (SYRINGE) IMPLANT
NDL HYPO 25X5/8 SAFETYGLIDE (NEEDLE) ×1 IMPLANT
NEEDLE HYPO 25X5/8 SAFETYGLIDE (NEEDLE) IMPLANT
NS IRRIG 1000ML POUR BTL (IV SOLUTION) ×2 IMPLANT
PACK C SECTION WH (CUSTOM PROCEDURE TRAY) ×2 IMPLANT
PAD ABD 7.5X8 STRL (GAUZE/BANDAGES/DRESSINGS) ×2 IMPLANT
RTRCTR C-SECT PINK 25CM LRG (MISCELLANEOUS) IMPLANT
RTRCTR C-SECT PINK 34CM XLRG (MISCELLANEOUS) IMPLANT
SLEEVE SCD COMPRESS KNEE MED (MISCELLANEOUS) ×1 IMPLANT
STAPLER VISISTAT 35W (STAPLE) IMPLANT
SUT MNCRL 0 VIOLET CTX 36 (SUTURE) ×2 IMPLANT
SUT MNCRL AB 3-0 PS2 27 (SUTURE) ×1 IMPLANT
SUT MONOCRYL 0 CTX 36 (SUTURE) ×2
SUT PDS AB 0 CTX 36 PDP370T (SUTURE) ×1 IMPLANT
SUT PLAIN 0 NONE (SUTURE) IMPLANT
SUT VIC AB 0 CT1 27 (SUTURE) ×4
SUT VIC AB 0 CT1 27XBRD ANBCTR (SUTURE) IMPLANT
SUT VIC AB 2-0 CT1 (SUTURE) IMPLANT
SUT VIC AB 2-0 CT1 27 (SUTURE) ×2
SUT VIC AB 2-0 CT1 TAPERPNT 27 (SUTURE) ×1 IMPLANT
SUT VIC AB 2-0 SH 27 (SUTURE)
SUT VIC AB 2-0 SH 27XBRD (SUTURE) IMPLANT
TAPE CLOTH SURG 4X10 WHT LF (GAUZE/BANDAGES/DRESSINGS) ×1 IMPLANT
TOWEL OR 17X24 6PK STRL BLUE (TOWEL DISPOSABLE) ×3 IMPLANT
TRAY FOLEY CATH 14FR (SET/KITS/TRAYS/PACK) ×1 IMPLANT
WATER STERILE IRR 1000ML POUR (IV SOLUTION) ×2 IMPLANT

## 2011-02-12 NOTE — Progress Notes (Signed)
IV antibiotics started after 500cc LR bolus, change IV rate to 175cc/h per Dr. Tamela Oddi.  Tylenol given for fever

## 2011-02-12 NOTE — OR Nursing (Signed)
Uterus massaged by S. Terrence Pizana Charity fundraiser. Foley catheter in place on arrival to OR. Urine color blood tinged.

## 2011-02-12 NOTE — Anesthesia Postprocedure Evaluation (Signed)
  Anesthesia Post-op Note  Patient: Heidi Hoover  Procedure(s) Performed:  CESAREAN SECTION - Primary cesarean section with delivery of baby boy at 73. Apgars 9/9.  Patient Location: PACU  Anesthesia Type: Epidural  Level of Consciousness: awake, alert  and oriented  Airway and Oxygen Therapy: Patient Spontanous Breathing  Post-op Pain: none  Post-op Assessment: Post-op Vital signs reviewed, Patient's Cardiovascular Status Stable, Respiratory Function Stable, Patent Airway, No signs of Nausea or vomiting, Pain level controlled, No headache, No backache, No residual numbness and No residual motor weakness  Post-op Vital Signs: Reviewed and stable  Complications: No apparent anesthesia complications

## 2011-02-12 NOTE — Progress Notes (Signed)
Heidi Hoover is a 25 y.o. G1P0 at [redacted]w[redacted]d by LMP admitted for rupture of membranes  Subjective:  Comfortable   Objective: BP 103/52  Pulse 80  Temp(Src) 99.1 F (37.3 C) (Oral)  Resp 18  Ht 5\' 2"  (1.575 m)  Wt 75.297 kg (166 lb)  BMI 30.36 kg/m2  SpO2 97%  LMP 05/02/2010 I/O last 3 completed shifts: In: -  Out: 850 [Urine:850]    FHT:  FHR: 140 bpm, variability: moderate,  accelerations:  Present,  decelerations:  Present variable, moderate UC:   regular, every 4 minutes SVE:   Dilation: 8.5 Effacement (%): Thick Station: 0 Exam by:: jackson-moore  Labs: Lab Results  Component Value Date   WBC 7.5 02/11/2011   HGB 12.1 02/11/2011   HCT 36.4 02/11/2011   MCV 78.1 02/11/2011   PLT 263 02/11/2011    Assessment / Plan: Arrest in active phase of labor  Labor: See above Preeclampsia:  N/A Fetal Wellbeing:  Category II; c/w fetal well-being Pain Control:  Epidural I/D:  Chorioamnionits on antibiotics Anticipated MOD:  C/D; informed consent obtained  JACKSON-MOORE,Lamees Gable A 02/12/2011, 10:21 AM

## 2011-02-12 NOTE — Op Note (Signed)
Cesarean Section Procedure Note   Heidi Hoover   02/12/2011  Indications: Dystocia   Pre-operative Diagnosis: arrest of active labor.   Post-operative Diagnosis: Same   Surgeon: Antionette Char A  Assistants: none  Anesthesia: epidural  Procedure Details:  The patient was seen in the Holding Room. The risks, benefits, complications, treatment options, and expected outcomes were discussed with the patient. The patient concurred with the proposed plan, giving informed consent. The patient was identified as Heidi Hoover and the procedure verified as C-Section Delivery. A Time Out was held and the above information confirmed.  After induction of anesthesia, the patient was draped and prepped in the usual sterile manner. A transverse incision was made and carried down through the subcutaneous tissue to the fascia. The fascial incision was made and extended transversely. The fascia was separated from the underlying rectus tissue superiorly and inferiorly. The peritoneum was identified and entered. The peritoneal incision was extended longitudinally. The utero-vesical peritoneal reflection was incised transversely and the bladder flap was bluntly freed from the lower uterine segment. A low transverse uterine incision was made. Delivered from cephalic presentation was a living newborn female infant.  A cord ph was not sent. The umbilical cord was clamped and cut cord. A sample was obtained for evaluation. The placenta was removed Intact and appeared normal.  The uterine incision was closed with running locked sutures of 1-0 Monocryl. A second imbricating layer of the same suture was placed.  Hemostasis was observed. The paracolic gutters were irrigated. The fascia was then reapproximated with running sutures of 1-0Vicryl. The subcuticular closure was performed using 3-0 Monocryl.  Instrument, sponge, and needle counts were correct prior the abdominal closure and were correct at the conclusion of the  case.    Findings:  Loose nuchal cord x 1.  Occiput posterior.   Estimated Blood Loss: 800 ml  Total IV Fluids:  per Anesthesiology   Urine Output:  per Anesthesiology  Specimens:  Specimens    None       Complications: no complications  Disposition: PACU - hemodynamically stable.  Maternal Condition: stable   Baby condition / location:  nursery-stable    Signed: Surgeon(s): Roseanna Rainbow, MD

## 2011-02-12 NOTE — Progress Notes (Signed)
Called Dr. Tamela Oddi to report slow cervical change, fhr and variables, vs.  No new orders at this time.  States she will be into see pt when making rounds this am.

## 2011-02-12 NOTE — Transfer of Care (Signed)
Immediate Anesthesia Transfer of Care Note  Patient: Heidi Hoover  Procedure(s) Performed:  CESAREAN SECTION - Primary cesarean section with delivery of baby boy at 31. Apgars 9/9.  Patient Location: PACU  Anesthesia Type: Epidural  Level of Consciousness: awake, alert  and oriented  Airway & Oxygen Therapy: Patient Spontanous Breathing  Post-op Assessment: Report given to PACU RN and Post -op Vital signs reviewed and stable  Post vital signs: Reviewed and stable  Complications: No apparent anesthesia complications

## 2011-02-12 NOTE — OR Nursing (Signed)
Two tubes of cord blood sent to lab.

## 2011-02-13 ENCOUNTER — Inpatient Hospital Stay (HOSPITAL_COMMUNITY): Admission: RE | Admit: 2011-02-13 | Payer: Self-pay | Source: Ambulatory Visit

## 2011-02-13 NOTE — Progress Notes (Signed)
  Subjective: POD# 1 s/p Cesarean Delivery.  Indications: failure to progress  RH status/Rubella reviewed. Feeding: breast Patient reports tolerating PO.  Denies HA/SOB/C/P/N/V/dizziness.  Reports flatus or BM. Breast symptoms:None.  She reports vaginal bleeding as normal, without clots.  She is ambulating, urinating without difficulty.     Objective: Vital signs in last 24 hours: BP 113/72  Pulse 103  Temp(Src) 98.6 F (37 C) (Oral)  Resp 18  Ht 5\' 2"  (1.575 m)  Wt 75.297 kg (166 lb)  BMI 30.36 kg/m2  SpO2 97%  LMP 05/02/2010  Breastfeeding? Unknown   Total I/O In: 960 [P.O.:960] Out: 1250 [Urine:1250]   Physical Exam:  General: alert CV: Regular rate and rhythm Resp: clear Abdomen: soft, nontender, normal bowel sounds Lochia: minimal Uterine Fundus: firm, below umbilicus, nontender Incision: clean, dry and intact Ext: extremities normal, atraumatic, no cyanosis or edema    Basename 02/13/11 0534 02/11/11 0720  HGB 10.4* 12.1  HCT -- 36.4      Assessment/Plan: 25 y.o.  status post Cesarean section. POD# 1.   Doing well, stable.              Advance diet as tolerated Start po pain meds D/C foley  HLIV  Ambulate IS Routine post-op care  JACKSON-MOORE,Roemello Speyer A 02/13/2011, 5:08 PM

## 2011-02-14 NOTE — Progress Notes (Signed)
  Pt started om kpad for continued incisional pain. Moderate relief achieved

## 2011-02-14 NOTE — Progress Notes (Signed)
Subjective: Postpartum Day 2: Cesarean Delivery Patient reports incisional pain, tolerating PO, + flatus and no problems voiding.    Objective: Vital signs in last 24 hours: Temp:  [98.4 F (36.9 C)-98.8 F (37.1 C)] 98.5 F (36.9 C) (09/10 0600) Pulse Rate:  [86-103] 88  (09/10 0600) Resp:  [18] 18  (09/10 0600) BP: (107-128)/(66-79) 108/66 mmHg (09/10 0600) SpO2:  [97 %] 97 % (09/09 1125)  Physical Exam:  General: alert and no distress Lochia: appropriate Uterine Fundus: firm Incision: healing well DVT Evaluation: No evidence of DVT seen on physical exam.   Basename 02/13/11 0534  HGB 10.4*  HCT --    Assessment/Plan: Status post Cesarean section. Doing well postoperatively.  Continue current care.  HARPER,CHARLES A 02/14/2011, 9:07 AM

## 2011-02-14 NOTE — Progress Notes (Signed)
UR chart review completed.  

## 2011-02-15 MED ORDER — IBUPROFEN 600 MG PO TABS: 600.0000 mg | ORAL_TABLET | Freq: Four times a day (QID) | ORAL | Status: DC

## 2011-02-15 MED ORDER — OXYCODONE-ACETAMINOPHEN 5-325 MG PO TABS: 1.0000 | ORAL_TABLET | ORAL | Status: DC | PRN

## 2011-02-15 NOTE — Progress Notes (Signed)
Post Partum Day 3 Subjective: no complaints, up ad lib, voiding, tolerating PO and + flatus  Objective: Blood pressure 99/59, pulse 75, temperature 97.6 F (36.4 C), temperature source Oral, resp. rate 18, height 5\' 2"  (1.575 m), weight 75.297 kg (166 lb), last menstrual period 05/02/2010, SpO2 97.00%, unknown if currently breastfeeding.  Physical Exam:  General: alert and no distress Lochia: appropriate Uterine Fundus: firm Incision: healing well DVT Evaluation: No evidence of DVT seen on physical exam.   Basename 02/13/11 0534  HGB 10.4*  HCT --    Assessment/Plan: Discharge home   LOS: 4 days   HARPER,CHARLES A 02/15/2011, 9:19 AM

## 2011-02-15 NOTE — Discharge Summary (Signed)
Obstetric Discharge Summary Reason for Admission: induction of labor Prenatal Procedures: ultrasound Intrapartum Procedures: cesarean: low cervical, transverse Postpartum Procedures: antibiotics Complications-Operative and Postpartum: none Hemoglobin  Date Value Range Status  02/13/2011 10.4* 12.0-15.0 (g/dL) Final     HCT  Date Value Range Status  02/11/2011 36.4  36.0-46.0 (%) Final    Discharge Diagnoses: Term Pregnancy-delivered  Discharge Information: Date: 02/15/2011 Activity: pelvic rest Diet: routine Medications: PNV, Ibuprophen and Percocet Condition: stable Instructions: refer to practice specific booklet Discharge to: home Follow-up Information    Follow up with Roseanna Rainbow, MD. Make an appointment in 2 weeks.   Contact information:   [address], [address] [place] [address] [phone]          Newborn Data: Live born female  Birth Weight: 7 lb 1.1 oz (3205 g) APGAR: 9, 9  Home with mother.  HARPER,CHARLES A 02/15/2011, 9:43 AM

## 2011-03-02 ENCOUNTER — Encounter (HOSPITAL_COMMUNITY): Payer: Self-pay | Admitting: Obstetrics & Gynecology

## 2011-03-09 HISTORY — PX: OTHER SURGICAL HISTORY: SHX169

## 2011-10-04 IMAGING — US US FETAL BPP W/O NONSTRESS
1 series · 7 of 7 positions shown · non-contrast
Comparison: none

[Series 1: us fetal bpp w/o nonstress · 0.26mm/px · 7 of 7 slices shown]
[im 1/7]
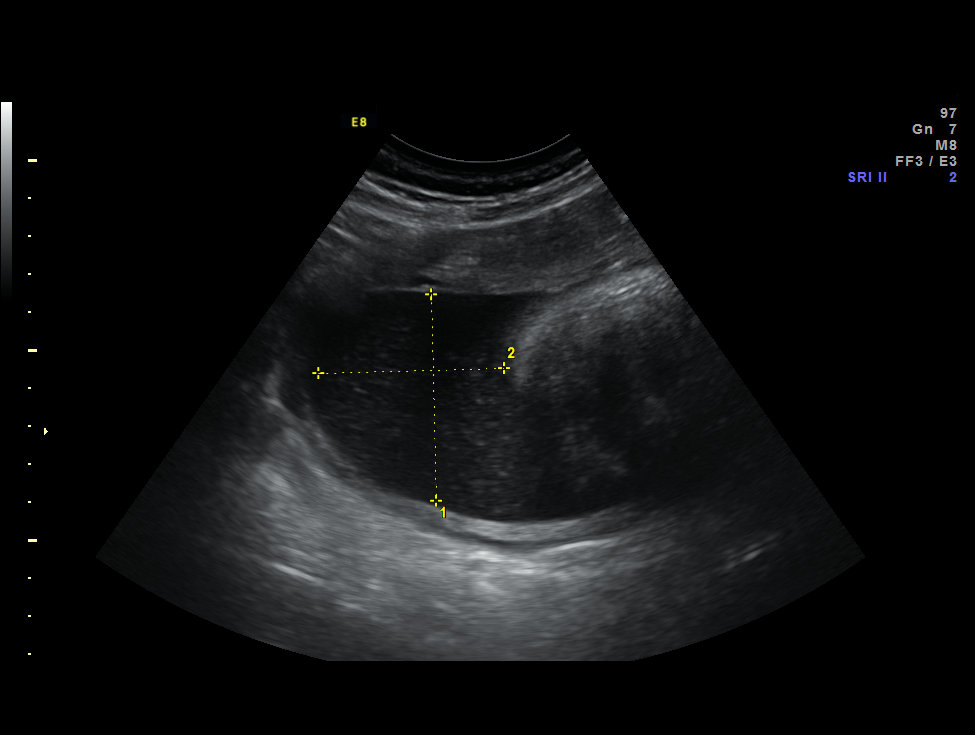
[im 2/7]
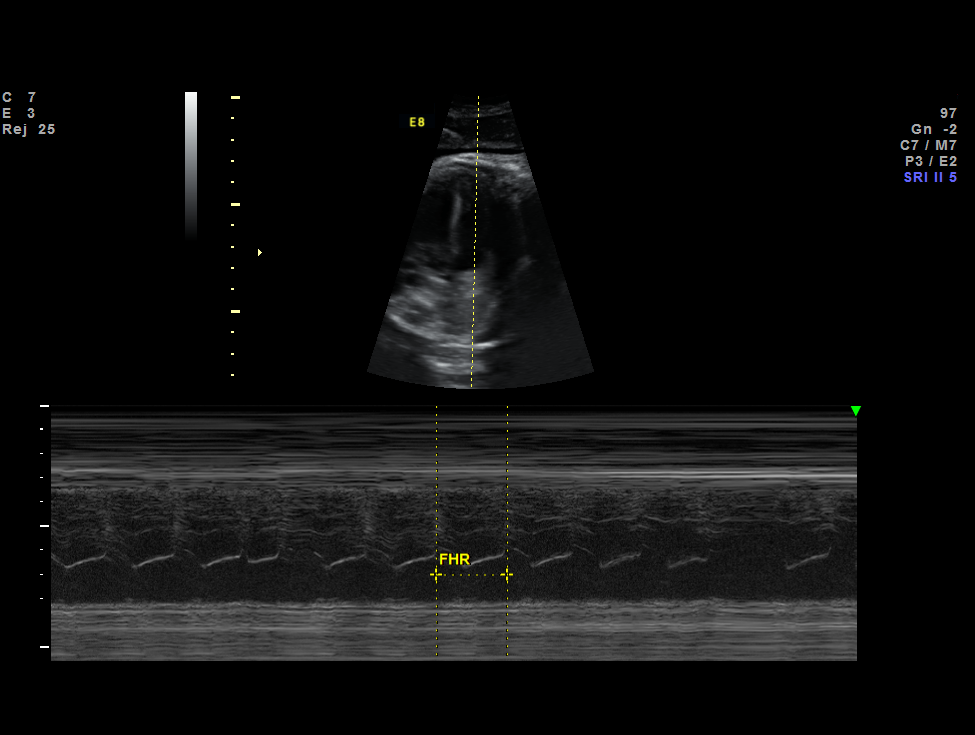
[im 3/7]
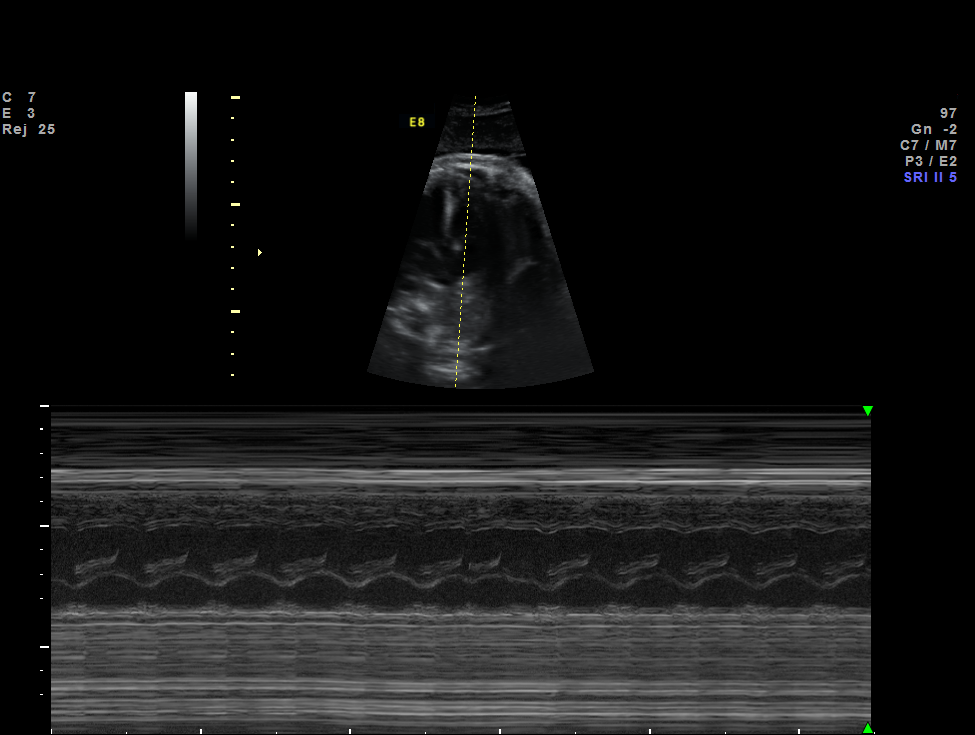
[im 4/7]
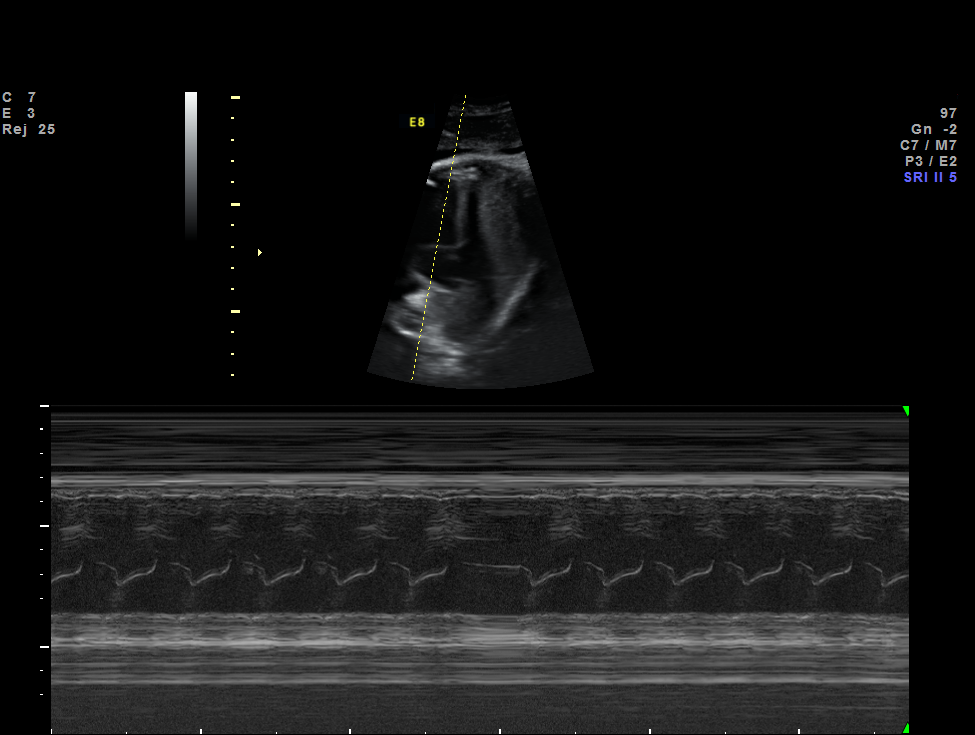
[im 5/7]
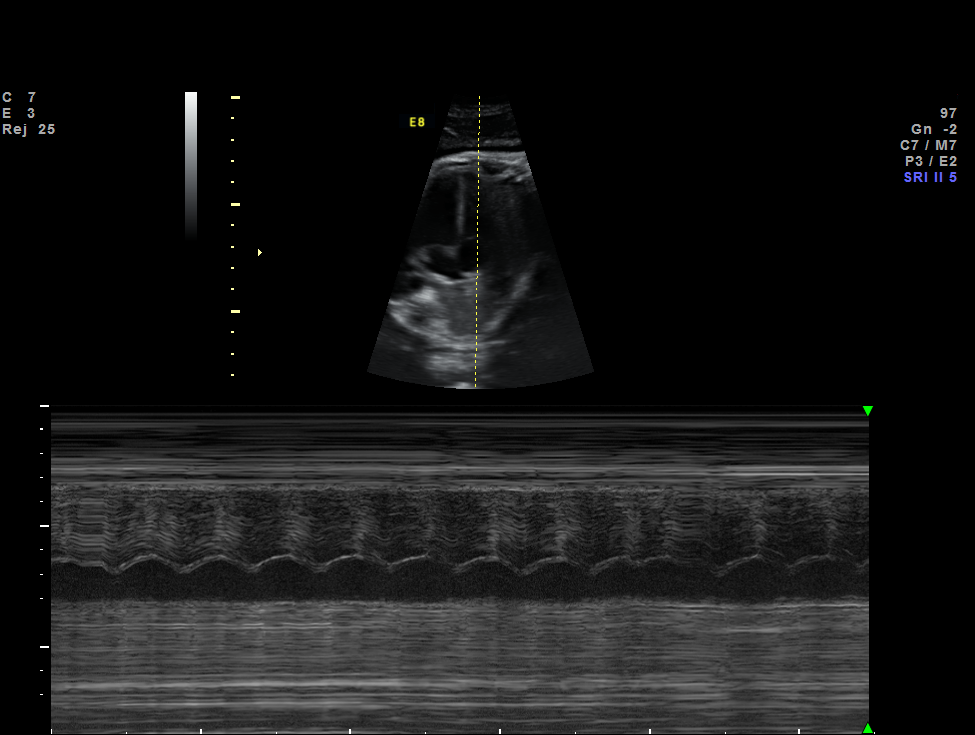
[im 6/7]
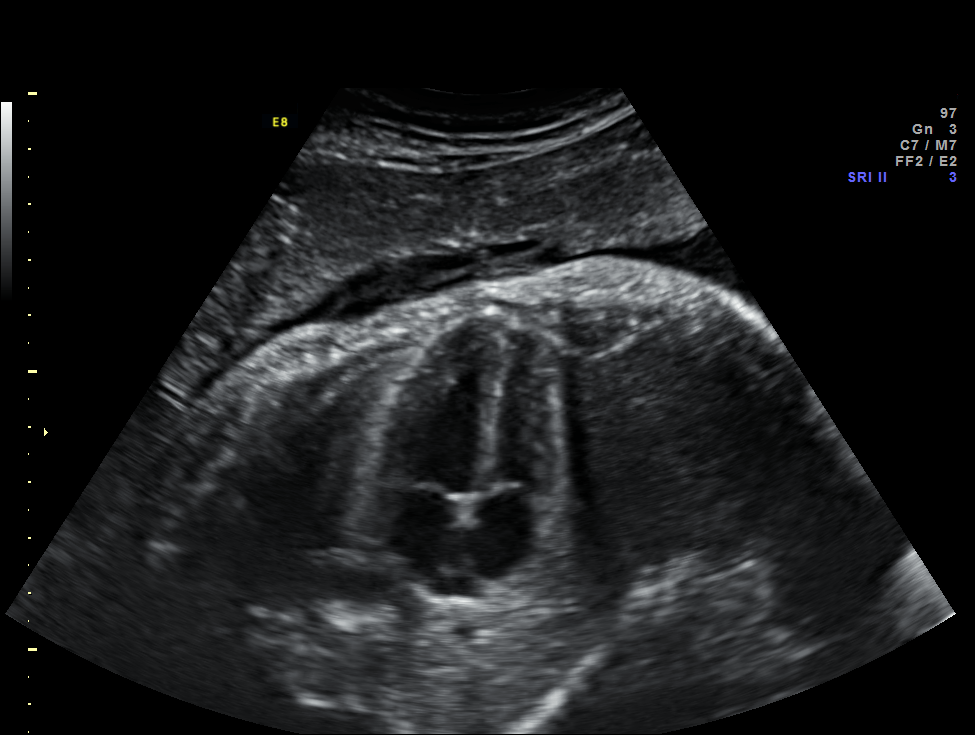
[im 7/7]
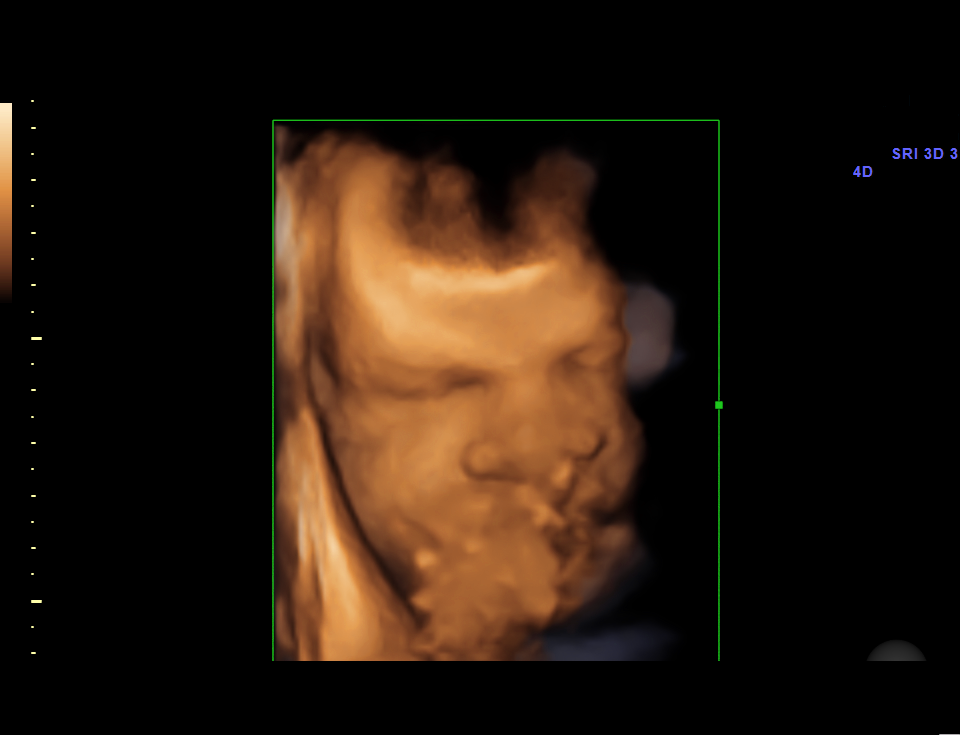

[7 of 7 positions shown; findings below may reference images not displayed]

Canned report from images found in remote index.

Refer to host system for actual result text.

## 2012-11-20 ENCOUNTER — Encounter: Payer: Self-pay | Admitting: Family Medicine

## 2012-11-20 ENCOUNTER — Ambulatory Visit (INDEPENDENT_AMBULATORY_CARE_PROVIDER_SITE_OTHER): Payer: BC Managed Care – PPO | Admitting: Family Medicine

## 2012-11-20 VITALS — BP 130/89 | HR 98 | Wt 135.0 lb

## 2012-11-20 DIAGNOSIS — N946 Dysmenorrhea, unspecified: Secondary | ICD-10-CM

## 2012-11-20 DIAGNOSIS — J45909 Unspecified asthma, uncomplicated: Secondary | ICD-10-CM

## 2012-11-20 MED ORDER — MONTELUKAST SODIUM 10 MG PO TABS: 10.0000 mg | ORAL_TABLET | Freq: Every day | ORAL | Status: AC

## 2012-11-20 MED ORDER — BUDESONIDE 0.5 MG/2ML IN SUSP: 0.5000 mg | Freq: Two times a day (BID) | RESPIRATORY_TRACT | Status: AC

## 2012-11-20 MED ORDER — ALBUTEROL SULFATE (2.5 MG/3ML) 0.083% IN NEBU: 2.5000 mg | INHALATION_SOLUTION | Freq: Four times a day (QID) | RESPIRATORY_TRACT | Status: AC | PRN

## 2012-11-20 MED ORDER — NORETHINDRONE ACET-ETHINYL EST 1-20 MG-MCG PO TABS: ORAL_TABLET | ORAL | Status: DC

## 2012-11-20 MED ORDER — IBUPROFEN 600 MG PO TABS: 600.0000 mg | ORAL_TABLET | Freq: Three times a day (TID) | ORAL | Status: AC | PRN

## 2012-11-20 MED ORDER — ALBUTEROL SULFATE HFA 108 (90 BASE) MCG/ACT IN AERS: 2.0000 | INHALATION_SPRAY | Freq: Four times a day (QID) | RESPIRATORY_TRACT | Status: AC | PRN

## 2012-11-20 NOTE — Progress Notes (Signed)
  Subjective:    Patient ID: Heidi Hoover, female    DOB: 1986-05-27, 27 y.o.   MRN: [id number]  HPI  Heidi Hoover is here today to discuss a few issues.  She was last seen by our office at [facility] in 06/2009.  She says that she has done well since her last visit. The biggest change in her life is that she had a baby (son) in 2012.    1)  Asthma:   She needs all her asthma/allergy medications refilled.  2)  Dysmenorrhea:  She had an Implanon placed in her left arm since 2012.  She has been having irregular, heavy periods and wants to discuss starting on oral contraceptives.   Review of Systems  Genitourinary: Positive for menstrual problem.   Past Medical History  Diagnosis Date  . Headache(784.0)     miagraine  . Asthma     albuterol solution, inhaler, singulair, zertec  . Allergy   . GERD (gastroesophageal reflux disease)    Family History  Problem Relation Age of Onset  . Asthma Father   . Heart disease Brother   . Mental retardation Maternal Uncle   . Arthritis Maternal Grandmother   . Heart disease Maternal Grandmother   . Hypertension Maternal Grandmother    History   Social History Narrative   Marital Status: Married Heidi Hoover)   Children:  7 Son    Pets: None    Living Situation: Lives with husband.   Occupation: CNA Chartered loss adjuster)   Education: Engineer, agricultural    Tobacco Use/Exposure:  None    Alcohol Use:  None   Drug Use:  None   Diet:  Regular   Exercise:  None   Hobbies: Cooking/Eating                Objective:   Physical Exam  Constitutional: She appears well-nourished. No distress.  HENT:  Head: Normocephalic.  Eyes: No scleral icterus.  Neck: No thyromegaly present.  Cardiovascular: Normal rate, regular rhythm and normal heart sounds.   Pulmonary/Chest: Effort normal and breath sounds normal.  Abdominal: There is no tenderness.  Musculoskeletal: She exhibits no edema and no tenderness.  Neurological: She is alert.  Skin:  Skin is warm and dry.  Psychiatric: She has a normal mood and affect. Her behavior is normal. Judgment and thought content normal.          Assessment & Plan:

## 2012-12-23 ENCOUNTER — Encounter: Payer: Self-pay | Admitting: Family Medicine

## 2012-12-23 DIAGNOSIS — N946 Dysmenorrhea, unspecified: Secondary | ICD-10-CM | POA: Insufficient documentation

## 2012-12-23 NOTE — Assessment & Plan Note (Addendum)
She was given refills for Singulair, Pulmicort and Albuterol.

## 2012-12-23 NOTE — Assessment & Plan Note (Signed)
She was given prescriptions for Ibuprofen 600 mg and Loestrin.

## 2014-04-07 ENCOUNTER — Encounter: Payer: Self-pay | Admitting: Family Medicine

## 2014-07-15 ENCOUNTER — Telehealth: Payer: Self-pay | Admitting: *Deleted

## 2014-07-15 NOTE — Telephone Encounter (Signed)
Patient interested in having her Nexplanon removed. Patient had it inserted 07-14-11. Patient states she is going to use birth control pills after removal. Patient states her PCP has already given her a prescription for Yasmin. Patient scheduled for 07-31-14 @ 1 pm.

## 2014-07-31 ENCOUNTER — Encounter: Payer: Self-pay | Admitting: Obstetrics

## 2014-07-31 ENCOUNTER — Ambulatory Visit (INDEPENDENT_AMBULATORY_CARE_PROVIDER_SITE_OTHER): Payer: BLUE CROSS/BLUE SHIELD | Admitting: Obstetrics

## 2014-07-31 VITALS — BP 127/81 | HR 83 | Wt 145.0 lb

## 2014-07-31 DIAGNOSIS — Z3202 Encounter for pregnancy test, result negative: Secondary | ICD-10-CM

## 2014-07-31 DIAGNOSIS — Z3009 Encounter for other general counseling and advice on contraception: Secondary | ICD-10-CM

## 2014-07-31 DIAGNOSIS — Z3046 Encounter for surveillance of implantable subdermal contraceptive: Secondary | ICD-10-CM

## 2014-07-31 DIAGNOSIS — IMO0002 Reserved for concepts with insufficient information to code with codable children: Secondary | ICD-10-CM

## 2014-07-31 DIAGNOSIS — Z3049 Encounter for surveillance of other contraceptives: Secondary | ICD-10-CM

## 2014-07-31 LAB — POCT URINE PREGNANCY: PREG TEST UR: NEGATIVE

## 2014-08-01 ENCOUNTER — Encounter: Payer: Self-pay | Admitting: Obstetrics

## 2014-08-01 NOTE — Progress Notes (Signed)

## 2014-08-14 ENCOUNTER — Ambulatory Visit: Payer: BLUE CROSS/BLUE SHIELD | Admitting: Obstetrics

## 2014-08-15 ENCOUNTER — Ambulatory Visit: Payer: BLUE CROSS/BLUE SHIELD | Admitting: Obstetrics

## 2015-12-25 ENCOUNTER — Telehealth: Payer: Self-pay | Admitting: *Deleted

## 2015-12-25 NOTE — Telephone Encounter (Signed)
Pt called to office regarding Nexplanon placement Return call to pt.  Pt states that she was told she would need 2 different appt in order to get a Nexplanon placed.   Pt was made aware that she is in need of an annual exam. Pt states that she had her exam with pap smear with her family doctor.   Pt advised to bring those records to office at her next appt in order to have them added to her chart here, along with her current ins information. Pt made aware that she should keep her appt as scheduled and if Dr Clearance CootsHarper chooses to insert a Nexplanon he may do that same day. Pt states understanding.

## 2016-01-13 ENCOUNTER — Encounter: Payer: Self-pay | Admitting: Obstetrics

## 2016-01-13 ENCOUNTER — Ambulatory Visit (INDEPENDENT_AMBULATORY_CARE_PROVIDER_SITE_OTHER): Payer: BLUE CROSS/BLUE SHIELD | Admitting: Obstetrics

## 2016-01-13 VITALS — BP 122/82 | HR 83 | Temp 98.2°F | Ht 62.0 in | Wt 147.6 lb

## 2016-01-13 DIAGNOSIS — Z30017 Encounter for initial prescription of implantable subdermal contraceptive: Secondary | ICD-10-CM

## 2016-01-13 DIAGNOSIS — Z3202 Encounter for pregnancy test, result negative: Secondary | ICD-10-CM | POA: Diagnosis not present

## 2016-01-13 DIAGNOSIS — Z30018 Encounter for initial prescription of other contraceptives: Secondary | ICD-10-CM

## 2016-01-13 DIAGNOSIS — Z3049 Encounter for surveillance of other contraceptives: Secondary | ICD-10-CM

## 2016-01-13 LAB — POCT URINE PREGNANCY: PREG TEST UR: NEGATIVE

## 2016-01-13 MED ORDER — NORETHINDRONE ACET-ETHINYL EST 1-20 MG-MCG PO TABS: ORAL_TABLET | ORAL | 99 refills | Status: AC

## 2016-01-13 NOTE — Progress Notes (Signed)
Nexplanon Procedure Note   PRE-OP DIAGNOSIS: desired long-term, reversible contraception  POST-OP DIAGNOSIS: Same  PROCEDURE: Nexplanon  placement Performing Provider: Coral Ceoharles Paizleigh Wilds MD  Patient education prior to procedure, explained risk, benefits of Nexplanon, reviewed alternative options. Patient reported understanding. Gave consent to continue with procedure.   PROCEDURE:  Pregnancy Text :  Negative Site (check):      left arm         Sterile Preparation:   Betadinex3 Lot # [id number] Expiration Date 08 / 2019  Insertion site was selected 8 - 10 cm from medial epicondyle and marked along with guiding site using sterile marker. Procedure area was prepped and draped in a sterile fashion. 1% Lidocaine 1.5 ml given prior to procedure. Nexplanon  was inserted subcutaneously.Needle was removed from the insertion site. Nexplanon capsule was palpated by provider and patient to assure satisfactory placement. Dressing applied.  Followup: The patient tolerated the procedure well without complications.  Standard post-procedure care is explained and return precautions are given.  Coral Ceoharles Devynn Scheff MD

## 2017-05-12 ENCOUNTER — Encounter: Payer: Self-pay | Admitting: Obstetrics

## 2017-05-12 ENCOUNTER — Ambulatory Visit: Payer: BLUE CROSS/BLUE SHIELD | Admitting: Obstetrics

## 2017-05-12 ENCOUNTER — Other Ambulatory Visit: Payer: Self-pay

## 2017-05-12 VITALS — BP 108/72 | HR 76 | Ht 62.0 in | Wt 136.0 lb

## 2017-05-12 DIAGNOSIS — Z3049 Encounter for surveillance of other contraceptives: Secondary | ICD-10-CM

## 2017-05-12 DIAGNOSIS — Z3009 Encounter for other general counseling and advice on contraception: Secondary | ICD-10-CM

## 2017-05-12 DIAGNOSIS — Z3046 Encounter for surveillance of implantable subdermal contraceptive: Secondary | ICD-10-CM

## 2017-05-12 DIAGNOSIS — Z30011 Encounter for initial prescription of contraceptive pills: Secondary | ICD-10-CM

## 2017-05-12 MED ORDER — LO LOESTRIN FE 1 MG-10 MCG / 10 MCG PO TABS: 1.0000 | ORAL_TABLET | Freq: Every day | ORAL | 4 refills | Status: AC

## 2017-05-12 NOTE — Progress Notes (Signed)
Presents for Nexplanon Removal. Her periods have been irregular since insertion, bleeding for 3++ weeks every month. Complains of cramps in her right calf 9/10.  She wants OCP.

## 2017-05-12 NOTE — Progress Notes (Signed)
NEXPLANON REMOVAL NOTE  Date of LMP:   unknown  Contraception used: *Nexplanon   Indications:  The patient desires removal of Nexplanon.  She understands risks, benefits, and alternatives to Nexplanon and would like to proceed.  Anesthesia:   Lidocaine 1% plain.  Procedure:  A time-out was performed confirming the procedure and the patient's allergy status.  Complications: None                      The rod was palpated and the area was sterilely prepped.  The area beneath the distal tip was anesthetized with 1% xylocaine and the skin incised                       Over the tip and the tip was exposed, grasped with forcep and removed intact.  A single suture of 4-0 Vicryl was used to close incision.  Steri strip                       And a bandage applied and the arm was wrapped with gauze bandage.  The patient tolerated well.  Instructions:  The patient was instructed to remove the dressing in 24 hours and that some bruising is to be expected.  She was advised to use over the counter analgesics as needed for any pain at the site.  She is to keep the area dry for 24 hours and to call if her hand or arm becomes cold, numb, or blue.  Return visit:  Return in 2 weeks

## 2017-05-23 ENCOUNTER — Telehealth: Payer: Self-pay | Admitting: Pediatrics

## 2017-05-23 ENCOUNTER — Other Ambulatory Visit: Payer: Self-pay | Admitting: Obstetrics

## 2017-05-23 DIAGNOSIS — Z30011 Encounter for initial prescription of contraceptive pills: Secondary | ICD-10-CM

## 2017-05-23 MED ORDER — NORETHIN ACE-ETH ESTRAD-FE 1-20 MG-MCG(24) PO TABS: 1.0000 | ORAL_TABLET | Freq: Every day | ORAL | 11 refills | Status: AC

## 2017-05-23 NOTE — Telephone Encounter (Signed)
Lo Loestrin FE is not covered on pt's ins. Pharmacy requesting alternative be sent. Please advise.

## 2017-05-23 NOTE — Telephone Encounter (Signed)
Loestrin 24 Fe prescribed.

## 2017-06-20 ENCOUNTER — Encounter: Payer: Self-pay | Admitting: *Deleted

## 2022-08-30 ENCOUNTER — Encounter (HOSPITAL_BASED_OUTPATIENT_CLINIC_OR_DEPARTMENT_OTHER): Payer: Self-pay

## 2022-08-30 ENCOUNTER — Emergency Department (HOSPITAL_BASED_OUTPATIENT_CLINIC_OR_DEPARTMENT_OTHER): Payer: Medicaid Other | Admitting: Radiology

## 2022-08-30 ENCOUNTER — Other Ambulatory Visit: Payer: Self-pay

## 2022-08-30 ENCOUNTER — Emergency Department (HOSPITAL_BASED_OUTPATIENT_CLINIC_OR_DEPARTMENT_OTHER)
Admission: EM | Admit: 2022-08-30 | Discharge: 2022-08-30 | Disposition: A | Discharge status: Home / Self Care | Payer: Medicaid Other | Attending: Emergency Medicine | Admitting: Emergency Medicine

## 2022-08-30 DIAGNOSIS — E876 Hypokalemia: Secondary | ICD-10-CM

## 2022-08-30 DIAGNOSIS — R002 Palpitations: Secondary | ICD-10-CM

## 2022-08-30 LAB — CBC WITH DIFFERENTIAL/PLATELET
Abs Immature Granulocytes: 0.01 10*3/uL (ref 0.00–0.07)
Basophils Absolute: 0 10*3/uL (ref 0.0–0.1)
Basophils Relative: 1 %
Eosinophils Absolute: 0.2 10*3/uL (ref 0.0–0.5)
Eosinophils Relative: 4 %
HCT: 42.6 % (ref 36.0–46.0)
Hemoglobin: 13.8 g/dL (ref 12.0–15.0)
Immature Granulocytes: 0 %
Lymphocytes Relative: 33 %
Lymphs Abs: 1.5 10*3/uL (ref 0.7–4.0)
MCH: 25.3 pg — ABNORMAL LOW (ref 26.0–34.0)
MCHC: 32.4 g/dL (ref 30.0–36.0)
MCV: 78 fL — ABNORMAL LOW (ref 80.0–100.0)
Monocytes Absolute: 0.5 10*3/uL (ref 0.1–1.0)
Monocytes Relative: 11 %
Neutro Abs: 2.3 10*3/uL (ref 1.7–7.7)
Neutrophils Relative %: 51 %
Platelets: 413 10*3/uL — ABNORMAL HIGH (ref 150–400)
RBC: 5.46 MIL/uL — ABNORMAL HIGH (ref 3.87–5.11)
RDW: 14.1 % (ref 11.5–15.5)
WBC: 4.4 10*3/uL (ref 4.0–10.5)
nRBC: 0 % (ref 0.0–0.2)

## 2022-08-30 LAB — COMPREHENSIVE METABOLIC PANEL
ALT: 11 U/L (ref 0–44)
AST: 16 U/L (ref 15–41)
Albumin: 4.3 g/dL (ref 3.5–5.0)
Alkaline Phosphatase: 70 U/L (ref 38–126)
Anion gap: 8 (ref 5–15)
BUN: 11 mg/dL (ref 6–20)
CO2: 27 mmol/L (ref 22–32)
Calcium: 9.5 mg/dL (ref 8.9–10.3)
Chloride: 101 mmol/L (ref 98–111)
Creatinine, Ser: 0.84 mg/dL (ref 0.44–1.00)
GFR, Estimated: >60 mL/min (ref >60)
Glucose, Bld: 94 mg/dL (ref 70–99)
Potassium: 3.1 mmol/L — ABNORMAL LOW (ref 3.5–5.1)
Sodium: 136 mmol/L (ref 135–145)
Total Bilirubin: 0.5 mg/dL (ref 0.3–1.2)
Total Protein: 7.4 g/dL (ref 6.5–8.1)

## 2022-08-30 LAB — PREGNANCY, URINE: Preg Test, Ur: NEGATIVE

## 2022-08-30 LAB — MAGNESIUM: Magnesium: 2 mg/dL (ref 1.7–2.4)

## 2022-08-30 LAB — TSH: TSH: 2.425 u[IU]/mL (ref 0.350–4.500)

## 2022-08-30 MED ORDER — SODIUM CHLORIDE 0.9 % IV BOLUS
1000.0000 mL | Freq: Once | INTRAVENOUS | Status: AC
  Administered 2022-08-30: 1000 mL via INTRAVENOUS

## 2022-08-30 MED ORDER — POTASSIUM CHLORIDE CRYS ER 20 MEQ PO TBCR
40.0000 meq | EXTENDED_RELEASE_TABLET | Freq: Once | ORAL | Status: AC
  Administered 2022-08-30: 40 meq via ORAL
  Filled 2022-08-30: qty 2

## 2022-08-30 NOTE — ED Provider Notes (Signed)
[facility] Provider Note   CSN: [id number] Arrival date & time: 08/30/22  2045     History  Chief Complaint  Patient presents with   Tachycardia    Pt reports tachycardia intermittently x 1 month up to 180-195, checks it with pulse ox, feels like her heart is racing. States she feels like it is racing now but not as bad as it has gotten before. Denies chest pain, SOB, or dizziness.     Heidi Hoover is a 37 y.o. female.  HPI 38 year old presents with tachycardia. Tonight around 7:00 pm she noticed palpitations. She's had a couple previous episodes over the past month or so. She used her child's pulse-ox and her HR was in the 180s and 190s. Lasted for 45-60 minutes. Her sister recommended she come to the ER. Shortly after leaving her house, symptoms resolved. She denies any chest pain, dyspnea, or lightheadedness.  She denies any illicit drug abuse besides taking an edible 3 weeks ago.  She drinks 1 cup of coffee per day.  She has been using a supplement similar to boost at her sister's recommendation for the past several weeks due to weight loss that occurred over the last month or so.  This was unintentional weight loss.  Home Medications Prior to Admission medications   Medication Sig Start Date End Date Taking? Authorizing Provider  albuterol (PROVENTIL HFA;VENTOLIN HFA) 108 (90 BASE) MCG/ACT inhaler Inhale 2 puffs into the lungs every 6 (six) hours as needed. For shortness of breath 11/20/12 11/20/13  Zanard, Bernadene Bell, MD  albuterol (PROVENTIL) (2.5 MG/3ML) 0.083% nebulizer solution Take 3 mLs (2.5 mg total) by nebulization every 6 (six) hours as needed for wheezing. 11/20/12   Zanard, Bernadene Bell, MD  budesonide (PULMICORT) 0.5 MG/2ML nebulizer solution Take 2 mLs (0.5 mg total) by nebulization 2 (two) times daily. 11/20/12 11/20/13  Jonathon Resides, MD  cetirizine (ZYRTEC) 10 MG tablet Take 10 mg by mouth daily.      [provider]  LO  LOESTRIN FE 1 MG-10 MCG / 10 MCG tablet Take 1 tablet by mouth daily. 05/12/17   Shelly Bombard, MD  montelukast (SINGULAIR) 10 MG tablet Take 1 tablet (10 mg total) by mouth at bedtime. 11/20/12 11/20/13  Jonathon Resides, MD  Norethindrone Acetate-Ethinyl Estrad-FE (LOESTRIN 24 FE) 1-20 MG-MCG(24) tablet Take 1 tablet by mouth daily. 05/23/17   Shelly Bombard, MD  norethindrone-ethinyl estradiol (MICROGESTIN,JUNEL,LOESTRIN) 1-20 MG-MCG tablet Take 1 tab daily for 3 weeks then start new pack 01/13/16 01/12/17  Shelly Bombard, MD      Allergies    Penicillins and Orange fruit [citrus]    Review of Systems   Review of Systems  Constitutional:  Negative for fever.  Respiratory:  Negative for shortness of breath.   Cardiovascular:  Positive for palpitations. Negative for chest pain and leg swelling.  Gastrointestinal:  Negative for vomiting.  Neurological:  Negative for light-headedness.    Physical Exam Updated Vital Signs BP (!) 148/75   Pulse 93   Temp 98.4 F (36.9 C) (Oral)   Resp 15   Ht 5\' 2"  (1.575 m)   Wt 59 kg   LMP 08/23/2022   SpO2 100%   BMI 23.78 kg/m  Physical Exam Vitals and nursing note reviewed.  Constitutional:      General: She is not in acute distress.    Appearance: She is well-developed. She is not ill-appearing or diaphoretic.  HENT:  Head: Normocephalic and atraumatic.  Cardiovascular:     Rate and Rhythm: Normal rate and regular rhythm.     Heart sounds: Normal heart sounds. No murmur heard. Pulmonary:     Effort: Pulmonary effort is normal.     Breath sounds: Normal breath sounds.  Abdominal:     General: There is no distension.     Palpations: Abdomen is soft.     Tenderness: There is no abdominal tenderness.  Musculoskeletal:     Right lower leg: No edema.     Left lower leg: No edema.  Skin:    General: Skin is warm and dry.  Neurological:     Mental Status: She is alert.     ED Results / Procedures / Treatments   Labs (all  labs ordered are listed, but only abnormal results are displayed) Labs Reviewed  COMPREHENSIVE METABOLIC PANEL - Abnormal; Notable for the following components:      Result Value   Potassium 3.1 (*)    All other components within normal limits  CBC WITH DIFFERENTIAL/PLATELET - Abnormal; Notable for the following components:   RBC 5.46 (*)    MCV 78.0 (*)    MCH 25.3 (*)    Platelets 413 (*)    All other components within normal limits  PREGNANCY, URINE  MAGNESIUM  TSH  T4, FREE    EKG EKG Interpretation  Date/Time:  Tuesday August 30 2022 20:58:30 EDT Ventricular Rate:  83 PR Interval:  123 QRS Duration: 76 QT Interval:  370 QTC Calculation: 435 R Axis:   70 Text Interpretation: Sinus rhythm no acute ST/T changes baseline wander limits interpretation No old tracing to compare Confirmed by Sherwood Gambler [phone]) on 08/30/2022 9:08:56 PM  Radiology DG Chest 2 View  Result Date: 08/30/2022 CLINICAL DATA:  Palpitations EXAM: CHEST - 2 VIEW COMPARISON:  Chest x-ray 03/28/2005 FINDINGS: The heart size and mediastinal contours are within normal limits. Both lungs are clear. The visualized skeletal structures are unremarkable. IMPRESSION: No active cardiopulmonary disease. Electronically Signed   By: Ronney Asters M.D.   On: 08/30/2022 21:44    Procedures Procedures    Medications Ordered in ED Medications  sodium chloride 0.9 % bolus 1,000 mL (1,000 mLs Intravenous New Bag/Given 08/30/22 2122)  potassium chloride SA (KLOR-CON M) CR tablet 40 mEq (40 mEq Oral Given 08/30/22 2153)    ED Course/ Medical Decision Making/ A&P                             Medical Decision Making Amount and/or Complexity of Data Reviewed Labs: ordered.    Details: Mild hypokalemia.  Otherwise normal magnesium, normal TSH. Radiology: ordered and independent interpretation performed.    Details: No cardiomegaly ECG/medicine tests: ordered and independent interpretation performed.    Details: No  arrhythmia/ischemia  Risk Prescription drug management.   Patient presents with palpitations.  She reports tachycardia at home though it is not present here.  I think given recurrent episodes we will have her follow-up with cardiology.  She does have a mild hypokalemia and so we will replace her potassium here but otherwise she has not had any symptoms here and has no anginal symptoms.  Highly doubt PE.  At this point, her workup is otherwise unremarkable including normal TSH and we will refer her to cardiology and give return precautions.        Final Clinical Impression(s) / ED Diagnoses Final diagnoses:  Palpitations  Hypokalemia    Rx / DC Orders ED Discharge Orders          Ordered    Ambulatory referral to Cardiology       Comments: If you have not heard from the Cardiology office within the next 72 hours please call [phone].   08/30/22 2239              Sherwood Gambler, MD 08/30/22 2251

## 2022-08-30 NOTE — Discharge Instructions (Addendum)
If you develop recurrent palpitations, if your palpitations don't go away, if you develop chest pain, shortness of breath, dizziness, or any other new/concerning symptoms then return to the ER or call 911.

## 2022-08-31 LAB — T4, FREE: Free T4: 0.87 ng/dL (ref 0.61–1.12)
